# Patient Record
Sex: Female | Born: 1935 | Race: White | Hispanic: No | Smoking: Never smoker
Health system: Southern US, Community
[De-identification: ages and names within clinical notes are randomized; demographics above are authoritative.]

## PROBLEM LIST (undated history)

## (undated) DIAGNOSIS — G459 Transient cerebral ischemic attack, unspecified: Secondary | ICD-10-CM

## (undated) DIAGNOSIS — M1990 Unspecified osteoarthritis, unspecified site: Secondary | ICD-10-CM

## (undated) DIAGNOSIS — I6529 Occlusion and stenosis of unspecified carotid artery: Secondary | ICD-10-CM

## (undated) DIAGNOSIS — I10 Essential (primary) hypertension: Secondary | ICD-10-CM

## (undated) DIAGNOSIS — Z87440 Personal history of urinary (tract) infections: Secondary | ICD-10-CM

## (undated) DIAGNOSIS — F0390 Unspecified dementia without behavioral disturbance: Secondary | ICD-10-CM

## (undated) DIAGNOSIS — E039 Hypothyroidism, unspecified: Secondary | ICD-10-CM

## (undated) HISTORY — PX: TOTAL KNEE ARTHROPLASTY: SHX125

## (undated) HISTORY — PX: CHOLECYSTECTOMY: SHX55

## (undated) HISTORY — PX: APPENDECTOMY: SHX54

---

## 2007-08-07 ENCOUNTER — Emergency Department: Payer: Self-pay | Admitting: Emergency Medicine

## 2007-08-07 ENCOUNTER — Other Ambulatory Visit: Payer: Self-pay

## 2007-08-31 ENCOUNTER — Ambulatory Visit: Payer: Self-pay | Admitting: Gastroenterology

## 2008-04-15 ENCOUNTER — Emergency Department: Payer: Self-pay | Admitting: Emergency Medicine

## 2009-01-18 ENCOUNTER — Emergency Department: Payer: Self-pay | Admitting: Unknown Physician Specialty

## 2009-06-14 ENCOUNTER — Emergency Department: Payer: Self-pay | Admitting: Emergency Medicine

## 2009-07-03 ENCOUNTER — Emergency Department: Payer: Self-pay | Admitting: Internal Medicine

## 2009-09-07 ENCOUNTER — Ambulatory Visit: Payer: Self-pay | Admitting: Cardiovascular Disease

## 2009-09-07 ENCOUNTER — Inpatient Hospital Stay: Payer: Self-pay | Admitting: Internal Medicine

## 2009-09-07 ENCOUNTER — Emergency Department: Payer: Self-pay | Admitting: Unknown Physician Specialty

## 2009-09-15 ENCOUNTER — Encounter: Payer: Self-pay | Admitting: Internal Medicine

## 2010-02-14 ENCOUNTER — Ambulatory Visit: Payer: Self-pay | Admitting: Internal Medicine

## 2010-02-26 ENCOUNTER — Emergency Department: Payer: Self-pay | Admitting: Emergency Medicine

## 2010-03-08 ENCOUNTER — Inpatient Hospital Stay: Payer: Self-pay | Admitting: Internal Medicine

## 2010-03-11 ENCOUNTER — Encounter: Payer: Self-pay | Admitting: Internal Medicine

## 2010-03-17 ENCOUNTER — Encounter: Payer: Self-pay | Admitting: Internal Medicine

## 2010-03-17 ENCOUNTER — Ambulatory Visit: Payer: Self-pay | Admitting: Internal Medicine

## 2010-04-15 ENCOUNTER — Encounter: Payer: Self-pay | Admitting: Internal Medicine

## 2010-05-16 ENCOUNTER — Encounter: Payer: Self-pay | Admitting: Internal Medicine

## 2010-06-15 ENCOUNTER — Encounter: Payer: Self-pay | Admitting: Internal Medicine

## 2010-07-16 ENCOUNTER — Encounter: Payer: Self-pay | Admitting: Internal Medicine

## 2010-08-15 ENCOUNTER — Encounter: Payer: Self-pay | Admitting: Internal Medicine

## 2010-09-04 ENCOUNTER — Emergency Department: Payer: Self-pay | Admitting: Emergency Medicine

## 2010-09-15 ENCOUNTER — Encounter: Payer: Self-pay | Admitting: Internal Medicine

## 2010-10-16 ENCOUNTER — Encounter: Payer: Self-pay | Admitting: Internal Medicine

## 2010-11-15 ENCOUNTER — Encounter: Payer: Self-pay | Admitting: Internal Medicine

## 2010-12-16 ENCOUNTER — Encounter: Payer: Self-pay | Admitting: Internal Medicine

## 2011-01-15 ENCOUNTER — Encounter: Payer: Self-pay | Admitting: Internal Medicine

## 2011-02-15 ENCOUNTER — Encounter: Payer: Self-pay | Admitting: Internal Medicine

## 2011-03-16 LAB — URINALYSIS, COMPLETE
Bilirubin,UR: NEGATIVE
Blood: NEGATIVE
Ketone: NEGATIVE
Ph: 6 (ref 4.5–8.0)
Protein: NEGATIVE
RBC,UR: 1 /HPF (ref 0–5)
Specific Gravity: 1.008 (ref 1.003–1.030)
Squamous Epithelial: 1
WBC UR: 2 /HPF (ref 0–5)

## 2011-03-17 LAB — URINE CULTURE

## 2011-03-18 ENCOUNTER — Encounter: Payer: Self-pay | Admitting: Internal Medicine

## 2011-03-28 LAB — URINALYSIS, COMPLETE
Bacteria: NONE SEEN
Bilirubin,UR: NEGATIVE
Blood: NEGATIVE
Nitrite: NEGATIVE
Ph: 5 (ref 4.5–8.0)
Protein: NEGATIVE
RBC,UR: <1 /HPF (ref 0–5)
Specific Gravity: 1.008 (ref 1.003–1.030)
Squamous Epithelial: 1
WBC UR: 2 /HPF (ref 0–5)

## 2011-04-15 ENCOUNTER — Encounter: Payer: Self-pay | Admitting: Internal Medicine

## 2011-05-16 ENCOUNTER — Encounter: Payer: Self-pay | Admitting: Internal Medicine

## 2011-05-26 LAB — BASIC METABOLIC PANEL
Anion Gap: 10 (ref 7–16)
BUN: 29 mg/dL — ABNORMAL HIGH (ref 7–18)
Chloride: 105 mmol/L (ref 98–107)
Creatinine: 0.91 mg/dL (ref 0.60–1.30)
EGFR (African American): >60
EGFR (Non-African Amer.): >60
Glucose: 75 mg/dL (ref 65–99)
Osmolality: 282 (ref 275–301)
Potassium: 4.6 mmol/L (ref 3.5–5.1)
Sodium: 139 mmol/L (ref 136–145)

## 2011-06-15 ENCOUNTER — Encounter: Payer: Self-pay | Admitting: Internal Medicine

## 2011-07-16 ENCOUNTER — Encounter: Payer: Self-pay | Admitting: Internal Medicine

## 2011-07-26 ENCOUNTER — Emergency Department: Payer: Self-pay | Admitting: Emergency Medicine

## 2011-08-15 ENCOUNTER — Encounter: Payer: Self-pay | Admitting: Internal Medicine

## 2011-08-25 LAB — CBC WITH DIFFERENTIAL/PLATELET
Basophil %: 0.2 %
Eosinophil #: 0 10*3/uL (ref 0.0–0.7)
HCT: 31.9 % — ABNORMAL LOW (ref 35.0–47.0)
HGB: 10.2 g/dL — ABNORMAL LOW (ref 12.0–16.0)
Lymphocyte %: 5.3 %
MCHC: 31.9 g/dL — ABNORMAL LOW (ref 32.0–36.0)
MCV: 87 fL (ref 80–100)
Monocyte #: 0.3 x10 3/mm (ref 0.2–0.9)
Monocyte %: 2.7 %
Neutrophil %: 91.8 %
RBC: 3.68 10*6/uL — ABNORMAL LOW (ref 3.80–5.20)
RDW: 15.7 % — ABNORMAL HIGH (ref 11.5–14.5)
WBC: 11.9 10*3/uL — ABNORMAL HIGH (ref 3.6–11.0)

## 2011-08-25 LAB — COMPREHENSIVE METABOLIC PANEL
Albumin: 2.6 g/dL — ABNORMAL LOW (ref 3.4–5.0)
Alkaline Phosphatase: 103 U/L (ref 50–136)
Anion Gap: 11 (ref 7–16)
Calcium, Total: 8.7 mg/dL (ref 8.5–10.1)
Co2: 28 mmol/L (ref 21–32)
EGFR (Non-African Amer.): 21 — ABNORMAL LOW
Glucose: 210 mg/dL — ABNORMAL HIGH (ref 65–99)
Osmolality: 297 (ref 275–301)
Potassium: 2.9 mmol/L — ABNORMAL LOW (ref 3.5–5.1)
SGOT(AST): 23 U/L (ref 15–37)
SGPT (ALT): 14 U/L
Total Protein: 7.3 g/dL (ref 6.4–8.2)

## 2011-08-25 LAB — URINALYSIS, COMPLETE
Bilirubin,UR: NEGATIVE
Blood: NEGATIVE
Ketone: NEGATIVE
Ph: 5 (ref 4.5–8.0)
Specific Gravity: 1.019 (ref 1.003–1.030)
Squamous Epithelial: 1
WBC UR: 153 /HPF (ref 0–5)

## 2011-08-28 LAB — URINE CULTURE

## 2011-09-08 LAB — BASIC METABOLIC PANEL
BUN: 17 mg/dL (ref 7–18)
Co2: 29 mmol/L (ref 21–32)
Creatinine: 1.07 mg/dL (ref 0.60–1.30)
EGFR (African American): 55 — ABNORMAL LOW
Sodium: 141 mmol/L (ref 136–145)

## 2011-09-08 LAB — MAGNESIUM: Magnesium: 1.7 mg/dL — ABNORMAL LOW

## 2011-09-15 ENCOUNTER — Encounter: Payer: Self-pay | Admitting: Internal Medicine

## 2011-10-16 ENCOUNTER — Encounter: Payer: Self-pay | Admitting: Internal Medicine

## 2011-11-07 LAB — URINALYSIS, COMPLETE
Bilirubin,UR: NEGATIVE
Glucose,UR: NEGATIVE mg/dL (ref 0–75)
Nitrite: NEGATIVE
RBC,UR: 17 /HPF (ref 0–5)
Specific Gravity: 1.017 (ref 1.003–1.030)
Squamous Epithelial: NONE SEEN

## 2011-11-10 LAB — URINE CULTURE

## 2011-11-15 ENCOUNTER — Encounter: Payer: Self-pay | Admitting: Internal Medicine

## 2011-12-01 LAB — URINALYSIS, COMPLETE
Blood: NEGATIVE
Nitrite: NEGATIVE
Protein: 100
Specific Gravity: 1.018 (ref 1.003–1.030)
Squamous Epithelial: 17
WBC UR: 3527 /HPF (ref 0–5)

## 2011-12-08 LAB — LIPID PANEL
Cholesterol: 160 mg/dL (ref 0–200)
Ldl Cholesterol, Calc: 92 mg/dL (ref 0–100)
Triglycerides: 149 mg/dL (ref 0–200)
VLDL Cholesterol, Calc: 30 mg/dL (ref 5–40)

## 2011-12-16 ENCOUNTER — Encounter: Payer: Self-pay | Admitting: Internal Medicine

## 2012-01-15 ENCOUNTER — Encounter: Payer: Self-pay | Admitting: Internal Medicine

## 2012-02-15 ENCOUNTER — Encounter: Payer: Self-pay | Admitting: Internal Medicine

## 2012-03-17 ENCOUNTER — Encounter: Payer: Self-pay | Admitting: Internal Medicine

## 2012-03-20 LAB — URINALYSIS, COMPLETE
Bilirubin,UR: NEGATIVE
Glucose,UR: NEGATIVE mg/dL (ref 0–75)
Ketone: NEGATIVE
Nitrite: POSITIVE
Ph: 5 (ref 4.5–8.0)
RBC,UR: 4 /HPF (ref 0–5)
Specific Gravity: 1.018 (ref 1.003–1.030)
Squamous Epithelial: 2

## 2012-03-22 LAB — URINE CULTURE

## 2012-03-30 LAB — TSH: Thyroid Stimulating Horm: 1.19 u[IU]/mL

## 2012-04-14 ENCOUNTER — Encounter: Payer: Self-pay | Admitting: Internal Medicine

## 2012-05-15 ENCOUNTER — Encounter: Payer: Self-pay | Admitting: Internal Medicine

## 2012-05-24 LAB — LIPID PANEL
Cholesterol: 184 mg/dL (ref 0–200)
HDL Cholesterol: 38 mg/dL — ABNORMAL LOW (ref 40–60)
Triglycerides: 212 mg/dL — ABNORMAL HIGH (ref 0–200)
VLDL Cholesterol, Calc: 42 mg/dL — ABNORMAL HIGH (ref 5–40)

## 2012-05-24 LAB — MAGNESIUM: Magnesium: 1.9 mg/dL

## 2012-06-14 ENCOUNTER — Encounter: Payer: Self-pay | Admitting: Internal Medicine

## 2012-07-15 ENCOUNTER — Encounter: Payer: Self-pay | Admitting: Internal Medicine

## 2012-07-17 LAB — TSH: Thyroid Stimulating Horm: 13.5 u[IU]/mL — ABNORMAL HIGH

## 2012-08-14 ENCOUNTER — Encounter: Payer: Self-pay | Admitting: Internal Medicine

## 2012-09-14 ENCOUNTER — Encounter: Payer: Self-pay | Admitting: Internal Medicine

## 2012-10-15 ENCOUNTER — Encounter: Payer: Self-pay | Admitting: Internal Medicine

## 2012-11-14 ENCOUNTER — Encounter: Payer: Self-pay | Admitting: Internal Medicine

## 2012-12-15 ENCOUNTER — Encounter: Payer: Self-pay | Admitting: Internal Medicine

## 2013-01-14 ENCOUNTER — Encounter: Payer: Self-pay | Admitting: Internal Medicine

## 2013-02-14 ENCOUNTER — Encounter: Payer: Self-pay | Admitting: Internal Medicine

## 2013-03-07 LAB — BASIC METABOLIC PANEL
Anion Gap: 6 — ABNORMAL LOW (ref 7–16)
BUN: 17 mg/dL (ref 7–18)
CREATININE: 1.08 mg/dL (ref 0.60–1.30)
Calcium, Total: 8.7 mg/dL (ref 8.5–10.1)
Chloride: 106 mmol/L (ref 98–107)
Co2: 27 mmol/L (ref 21–32)
EGFR (African American): 53 — ABNORMAL LOW
EGFR (Non-African Amer.): 46 — ABNORMAL LOW
Glucose: 127 mg/dL — ABNORMAL HIGH (ref 65–99)
OSMOLALITY: 281 (ref 275–301)
Potassium: 3.9 mmol/L (ref 3.5–5.1)
Sodium: 139 mmol/L (ref 136–145)

## 2013-03-17 ENCOUNTER — Encounter: Payer: Self-pay | Admitting: Internal Medicine

## 2013-03-29 LAB — URINALYSIS, COMPLETE
Bacteria: NONE SEEN
Bilirubin,UR: NEGATIVE
Glucose,UR: NEGATIVE mg/dL (ref 0–75)
Ketone: NEGATIVE
NITRITE: NEGATIVE
PH: 6 (ref 4.5–8.0)
RBC,UR: 43 /HPF (ref 0–5)
Specific Gravity: 1.019 (ref 1.003–1.030)
Squamous Epithelial: 2
WBC UR: 771 /HPF (ref 0–5)

## 2013-04-01 LAB — URINE CULTURE

## 2013-04-14 ENCOUNTER — Encounter: Payer: Self-pay | Admitting: Internal Medicine

## 2013-05-15 ENCOUNTER — Encounter: Payer: Self-pay | Admitting: Internal Medicine

## 2013-06-13 LAB — MAGNESIUM: MAGNESIUM: 1.8 mg/dL

## 2013-06-13 LAB — TSH: THYROID STIMULATING HORM: 32.9 u[IU]/mL — AB

## 2013-06-26 ENCOUNTER — Inpatient Hospital Stay: Payer: Self-pay | Admitting: Internal Medicine

## 2013-06-26 LAB — COMPREHENSIVE METABOLIC PANEL
ALK PHOS: 97 U/L
ALT: 11 U/L — AB (ref 12–78)
ANION GAP: 12 (ref 7–16)
AST: 26 U/L (ref 15–37)
Albumin: 3.2 g/dL — ABNORMAL LOW (ref 3.4–5.0)
Albumin: 3.4 g/dL (ref 3.4–5.0)
Alkaline Phosphatase: 94 U/L
Anion Gap: 6 — ABNORMAL LOW (ref 7–16)
BUN: 15 mg/dL (ref 7–18)
BUN: 15 mg/dL (ref 7–18)
Bilirubin,Total: 0.4 mg/dL (ref 0.2–1.0)
Bilirubin,Total: 0.3 mg/dL (ref 0.2–1.0)
CHLORIDE: 104 mmol/L (ref 98–107)
CHLORIDE: 104 mmol/L (ref 98–107)
CO2: 28 mmol/L (ref 21–32)
CREATININE: 1.3 mg/dL (ref 0.60–1.30)
CREATININE: 1.34 mg/dL — AB (ref 0.60–1.30)
Calcium, Total: 9.2 mg/dL (ref 8.5–10.1)
Calcium, Total: 9.1 mg/dL (ref 8.5–10.1)
Co2: 21 mmol/L (ref 21–32)
EGFR (African American): 41 — ABNORMAL LOW
EGFR (Non-African Amer.): 37 — ABNORMAL LOW
EGFR (Non-African Amer.): 36 — ABNORMAL LOW
GFR CALC AF AMER: 43 — AB
Glucose: 140 mg/dL — ABNORMAL HIGH (ref 65–99)
Glucose: 107 mg/dL — ABNORMAL HIGH (ref 65–99)
OSMOLALITY: 277 (ref 275–301)
OSMOLALITY: 277 (ref 275–301)
Potassium: 4.1 mmol/L (ref 3.5–5.1)
Potassium: 3.7 mmol/L (ref 3.5–5.1)
SGOT(AST): 19 U/L (ref 15–37)
SGPT (ALT): 10 U/L — ABNORMAL LOW (ref 12–78)
SODIUM: 138 mmol/L (ref 136–145)
Sodium: 137 mmol/L (ref 136–145)
Total Protein: 8 g/dL (ref 6.4–8.2)
Total Protein: 8.1 g/dL (ref 6.4–8.2)

## 2013-06-26 LAB — CBC
HCT: 23.8 % — AB (ref 35.0–47.0)
HGB: 6.7 g/dL — ABNORMAL LOW (ref 12.0–16.0)
MCH: 17.1 pg — ABNORMAL LOW (ref 26.0–34.0)
MCHC: 28 g/dL — AB (ref 32.0–36.0)
MCV: 61 fL — AB (ref 80–100)
PLATELETS: 316 10*3/uL (ref 150–440)
RBC: 3.89 10*6/uL (ref 3.80–5.20)
RDW: 22.5 % — ABNORMAL HIGH (ref 11.5–14.5)
WBC: 10.1 10*3/uL (ref 3.6–11.0)

## 2013-06-26 LAB — IRON AND TIBC
Iron Bind.Cap.(Total): 515 ug/dL — ABNORMAL HIGH (ref 250–450)
Iron Saturation: 4 %
Iron: 21 ug/dL — ABNORMAL LOW (ref 50–170)
UNBOUND IRON-BIND. CAP.: 494 ug/dL

## 2013-06-26 LAB — CBC WITH DIFFERENTIAL/PLATELET
BASOS PCT: 3.4 %
Basophil #: 0.4 10*3/uL — ABNORMAL HIGH (ref 0.0–0.1)
EOS ABS: 0.2 10*3/uL (ref 0.0–0.7)
EOS PCT: 2.3 %
HCT: 24.7 % — ABNORMAL LOW (ref 35.0–47.0)
HGB: 6.7 g/dL — AB (ref 12.0–16.0)
LYMPHS ABS: 1.8 10*3/uL (ref 1.0–3.6)
LYMPHS PCT: 17.9 %
MCH: 16.7 pg — ABNORMAL LOW (ref 26.0–34.0)
MCHC: 27.1 g/dL — ABNORMAL LOW (ref 32.0–36.0)
MCV: 62 fL — AB (ref 80–100)
Monocyte #: 0.6 x10 3/mm (ref 0.2–0.9)
Monocyte %: 5.5 %
NEUTROS ABS: 7.3 10*3/uL — AB (ref 1.4–6.5)
NEUTROS PCT: 70.9 %
PLATELETS: 298 10*3/uL (ref 150–440)
RBC: 4.01 10*6/uL (ref 3.80–5.20)
RDW: 22.1 % — ABNORMAL HIGH (ref 11.5–14.5)
WBC: 10.2 10*3/uL (ref 3.6–11.0)

## 2013-06-26 LAB — URINALYSIS, COMPLETE
Bilirubin,UR: NEGATIVE
Glucose,UR: NEGATIVE mg/dL (ref 0–75)
Ketone: NEGATIVE
Nitrite: NEGATIVE
Ph: 5 (ref 4.5–8.0)
Protein: 100
RBC,UR: 13 /HPF (ref 0–5)
SPECIFIC GRAVITY: 1.018 (ref 1.003–1.030)
Squamous Epithelial: 8

## 2013-06-27 LAB — BASIC METABOLIC PANEL
Anion Gap: 6 — ABNORMAL LOW (ref 7–16)
BUN: 16 mg/dL (ref 7–18)
CHLORIDE: 106 mmol/L (ref 98–107)
CO2: 27 mmol/L (ref 21–32)
Calcium, Total: 8.9 mg/dL (ref 8.5–10.1)
Creatinine: 1.23 mg/dL (ref 0.60–1.30)
EGFR (African American): 46 — ABNORMAL LOW
GFR CALC NON AF AMER: 39 — AB
GLUCOSE: 105 mg/dL — AB (ref 65–99)
OSMOLALITY: 279 (ref 275–301)
Potassium: 4.3 mmol/L (ref 3.5–5.1)
Sodium: 139 mmol/L (ref 136–145)

## 2013-06-27 LAB — CBC WITH DIFFERENTIAL/PLATELET
BASOS ABS: 0.1 10*3/uL (ref 0.0–0.1)
Basophil %: 1.1 %
EOS PCT: 1.9 %
Eosinophil #: 0.2 10*3/uL (ref 0.0–0.7)
HCT: 26.3 % — ABNORMAL LOW (ref 35.0–47.0)
HGB: 7.9 g/dL — AB (ref 12.0–16.0)
Lymphocyte #: 1.9 10*3/uL (ref 1.0–3.6)
Lymphocyte %: 19.8 %
MCH: 19.7 pg — ABNORMAL LOW (ref 26.0–34.0)
MCHC: 30 g/dL — ABNORMAL LOW (ref 32.0–36.0)
MCV: 66 fL — AB (ref 80–100)
MONO ABS: 0.8 x10 3/mm (ref 0.2–0.9)
MONOS PCT: 8.1 %
NEUTROS ABS: 6.6 10*3/uL — AB (ref 1.4–6.5)
NEUTROS PCT: 69.1 %
Platelet: 235 10*3/uL (ref 150–440)
RBC: 4.01 10*6/uL (ref 3.80–5.20)
RDW: 28.6 % — ABNORMAL HIGH (ref 11.5–14.5)
WBC: 9.5 10*3/uL (ref 3.6–11.0)

## 2013-06-28 LAB — URINE CULTURE

## 2013-07-02 ENCOUNTER — Encounter: Payer: Self-pay | Admitting: Internal Medicine

## 2013-07-02 LAB — CBC WITH DIFFERENTIAL/PLATELET
BASOS ABS: 0.1 10*3/uL (ref 0.0–0.1)
BASOS PCT: 1.4 %
EOS ABS: 0.4 10*3/uL (ref 0.0–0.7)
Eosinophil %: 4.8 %
HCT: 27.1 % — ABNORMAL LOW (ref 35.0–47.0)
HGB: 8 g/dL — ABNORMAL LOW (ref 12.0–16.0)
Lymphocyte #: 1.2 10*3/uL (ref 1.0–3.6)
Lymphocyte %: 14.9 %
MCH: 19.9 pg — AB (ref 26.0–34.0)
MCHC: 29.5 g/dL — AB (ref 32.0–36.0)
MCV: 67 fL — AB (ref 80–100)
Monocyte #: 0.6 x10 3/mm (ref 0.2–0.9)
Monocyte %: 7.3 %
Neutrophil #: 5.7 10*3/uL (ref 1.4–6.5)
Neutrophil %: 71.6 %
Platelet: 193 10*3/uL (ref 150–440)
RBC: 4.02 10*6/uL (ref 3.80–5.20)
RDW: 30.2 % — ABNORMAL HIGH (ref 11.5–14.5)
WBC: 8 10*3/uL (ref 3.6–11.0)

## 2013-07-15 ENCOUNTER — Encounter: Payer: Self-pay | Admitting: Internal Medicine

## 2013-08-01 LAB — CBC WITH DIFFERENTIAL/PLATELET
Basophil #: 0.1 10*3/uL (ref 0.0–0.1)
Basophil %: 2.3 %
EOS ABS: 0.3 10*3/uL (ref 0.0–0.7)
Eosinophil %: 5.3 %
HCT: 36.2 % (ref 35.0–47.0)
HGB: 11.1 g/dL — ABNORMAL LOW (ref 12.0–16.0)
LYMPHS PCT: 17.7 %
Lymphocyte #: 1.1 10*3/uL (ref 1.0–3.6)
MCH: 23.9 pg — ABNORMAL LOW (ref 26.0–34.0)
MCHC: 30.7 g/dL — ABNORMAL LOW (ref 32.0–36.0)
MCV: 78 fL — ABNORMAL LOW (ref 80–100)
Monocyte #: 0.5 x10 3/mm (ref 0.2–0.9)
Monocyte %: 7.7 %
Neutrophil #: 4.2 10*3/uL (ref 1.4–6.5)
Neutrophil %: 67 %
Platelet: 280 10*3/uL (ref 150–440)
RBC: 4.64 10*6/uL (ref 3.80–5.20)
RDW: 34 % — ABNORMAL HIGH (ref 11.5–14.5)
WBC: 6.3 10*3/uL (ref 3.6–11.0)

## 2013-08-01 LAB — TSH: Thyroid Stimulating Horm: 0.322 u[IU]/mL — ABNORMAL LOW

## 2013-08-14 ENCOUNTER — Encounter: Payer: Self-pay | Admitting: Internal Medicine

## 2013-09-12 LAB — TSH: THYROID STIMULATING HORM: 0.169 u[IU]/mL — AB

## 2013-09-12 LAB — CBC WITH DIFFERENTIAL/PLATELET
Basophil #: 0.1 10*3/uL (ref 0.0–0.1)
Basophil %: 1 %
EOS PCT: 4.8 %
Eosinophil #: 0.3 10*3/uL (ref 0.0–0.7)
HCT: 39.5 % (ref 35.0–47.0)
HGB: 12.4 g/dL (ref 12.0–16.0)
LYMPHS ABS: 1.1 10*3/uL (ref 1.0–3.6)
Lymphocyte %: 16 %
MCH: 27.3 pg (ref 26.0–34.0)
MCHC: 31.4 g/dL — ABNORMAL LOW (ref 32.0–36.0)
MCV: 87 fL (ref 80–100)
MONO ABS: 0.5 x10 3/mm (ref 0.2–0.9)
Monocyte %: 7.1 %
Neutrophil #: 5 10*3/uL (ref 1.4–6.5)
Neutrophil %: 71.1 %
Platelet: 251 10*3/uL (ref 150–440)
RBC: 4.56 10*6/uL (ref 3.80–5.20)
RDW: 23.9 % — ABNORMAL HIGH (ref 11.5–14.5)
WBC: 7.1 10*3/uL (ref 3.6–11.0)

## 2013-09-12 LAB — LIPID PANEL
Cholesterol: 179 mg/dL (ref 0–200)
HDL: 43 mg/dL (ref 40–60)
Ldl Cholesterol, Calc: 110 mg/dL — ABNORMAL HIGH (ref 0–100)
Triglycerides: 128 mg/dL (ref 0–200)
VLDL Cholesterol, Calc: 26 mg/dL (ref 5–40)

## 2013-09-14 ENCOUNTER — Encounter: Payer: Self-pay | Admitting: Internal Medicine

## 2013-10-15 ENCOUNTER — Encounter: Payer: Self-pay | Admitting: Internal Medicine

## 2013-10-24 LAB — CBC WITH DIFFERENTIAL/PLATELET
BASOS ABS: 0.1 10*3/uL (ref 0.0–0.1)
Basophil %: 1.2 %
Eosinophil #: 0.6 10*3/uL (ref 0.0–0.7)
Eosinophil %: 9.2 %
HCT: 40.3 % (ref 35.0–47.0)
HGB: 12.7 g/dL (ref 12.0–16.0)
Lymphocyte #: 1.4 10*3/uL (ref 1.0–3.6)
Lymphocyte %: 20 %
MCH: 28.2 pg (ref 26.0–34.0)
MCHC: 31.5 g/dL — ABNORMAL LOW (ref 32.0–36.0)
MCV: 90 fL (ref 80–100)
Monocyte #: 0.5 x10 3/mm (ref 0.2–0.9)
Monocyte %: 8.1 %
NEUTROS ABS: 4.2 10*3/uL (ref 1.4–6.5)
NEUTROS PCT: 61.5 %
Platelet: 274 10*3/uL (ref 150–440)
RBC: 4.51 10*6/uL (ref 3.80–5.20)
RDW: 14.6 % — ABNORMAL HIGH (ref 11.5–14.5)
WBC: 6.8 10*3/uL (ref 3.6–11.0)

## 2013-10-24 LAB — TSH: Thyroid Stimulating Horm: 1.81 u[IU]/mL

## 2013-11-14 ENCOUNTER — Encounter: Payer: Self-pay | Admitting: Internal Medicine

## 2013-11-18 ENCOUNTER — Emergency Department: Payer: Self-pay | Admitting: Emergency Medicine

## 2013-11-18 ENCOUNTER — Observation Stay (HOSPITAL_COMMUNITY)
Admission: AD | Admit: 2013-11-18 | Discharge: 2013-11-19 | Disposition: A | Discharge status: Skilled Nursing Facility | Payer: Medicare Other | Source: Other Acute Inpatient Hospital | Attending: Internal Medicine | Admitting: Internal Medicine

## 2013-11-18 ENCOUNTER — Encounter (HOSPITAL_COMMUNITY): Payer: Self-pay | Admitting: General Practice

## 2013-11-18 ENCOUNTER — Observation Stay (HOSPITAL_COMMUNITY): Payer: Medicare Other

## 2013-11-18 DIAGNOSIS — E039 Hypothyroidism, unspecified: Secondary | ICD-10-CM | POA: Diagnosis not present

## 2013-11-18 DIAGNOSIS — Z66 Do not resuscitate: Secondary | ICD-10-CM | POA: Diagnosis not present

## 2013-11-18 DIAGNOSIS — I6200 Nontraumatic subdural hemorrhage, unspecified: Secondary | ICD-10-CM

## 2013-11-18 DIAGNOSIS — Z23 Encounter for immunization: Secondary | ICD-10-CM | POA: Insufficient documentation

## 2013-11-18 DIAGNOSIS — W19XXXA Unspecified fall, initial encounter: Secondary | ICD-10-CM | POA: Insufficient documentation

## 2013-11-18 DIAGNOSIS — I10 Essential (primary) hypertension: Secondary | ICD-10-CM

## 2013-11-18 DIAGNOSIS — F0390 Unspecified dementia without behavioral disturbance: Secondary | ICD-10-CM | POA: Insufficient documentation

## 2013-11-18 DIAGNOSIS — Z79899 Other long term (current) drug therapy: Secondary | ICD-10-CM | POA: Insufficient documentation

## 2013-11-18 DIAGNOSIS — F0280 Dementia in other diseases classified elsewhere without behavioral disturbance: Secondary | ICD-10-CM | POA: Diagnosis not present

## 2013-11-18 DIAGNOSIS — S065XAA Traumatic subdural hemorrhage with loss of consciousness status unknown, initial encounter: Secondary | ICD-10-CM | POA: Diagnosis present

## 2013-11-18 DIAGNOSIS — Y92129 Unspecified place in nursing home as the place of occurrence of the external cause: Secondary | ICD-10-CM | POA: Diagnosis not present

## 2013-11-18 DIAGNOSIS — E038 Other specified hypothyroidism: Secondary | ICD-10-CM

## 2013-11-18 DIAGNOSIS — S065X0A Traumatic subdural hemorrhage without loss of consciousness, initial encounter: Principal | ICD-10-CM | POA: Insufficient documentation

## 2013-11-18 DIAGNOSIS — S065X9A Traumatic subdural hemorrhage with loss of consciousness of unspecified duration, initial encounter: Secondary | ICD-10-CM

## 2013-11-18 HISTORY — DX: Essential (primary) hypertension: I10

## 2013-11-18 HISTORY — DX: Personal history of urinary (tract) infections: Z87.440

## 2013-11-18 HISTORY — DX: Occlusion and stenosis of unspecified carotid artery: I65.29

## 2013-11-18 HISTORY — DX: Unspecified osteoarthritis, unspecified site: M19.90

## 2013-11-18 HISTORY — DX: Unspecified dementia, unspecified severity, without behavioral disturbance, psychotic disturbance, mood disturbance, and anxiety: F03.90

## 2013-11-18 HISTORY — DX: Transient cerebral ischemic attack, unspecified: G45.9

## 2013-11-18 HISTORY — DX: Hypothyroidism, unspecified: E03.9

## 2013-11-18 LAB — CBC
HEMATOCRIT: 38.7 % (ref 36.0–46.0)
Hemoglobin: 12.9 g/dL (ref 12.0–15.0)
MCH: 29.1 pg (ref 26.0–34.0)
MCHC: 33.3 g/dL (ref 30.0–36.0)
MCV: 87.4 fL (ref 78.0–100.0)
PLATELETS: 302 10*3/uL (ref 150–400)
RBC: 4.43 MIL/uL (ref 3.87–5.11)
RDW: 13.8 % (ref 11.5–15.5)
WBC: 9.7 10*3/uL (ref 4.0–10.5)

## 2013-11-18 LAB — CBC WITH DIFFERENTIAL/PLATELET
Basophil #: 0.1 10*3/uL (ref 0.0–0.1)
Basophil %: 0.5 %
Eosinophil #: 0.1 10*3/uL (ref 0.0–0.7)
Eosinophil %: 1 %
HCT: 44.4 % (ref 35.0–47.0)
HGB: 14 g/dL (ref 12.0–16.0)
Lymphocyte #: 0.8 10*3/uL — ABNORMAL LOW (ref 1.0–3.6)
Lymphocyte %: 6.4 %
MCH: 28.2 pg (ref 26.0–34.0)
MCHC: 31.4 g/dL — ABNORMAL LOW (ref 32.0–36.0)
MCV: 90 fL (ref 80–100)
MONO ABS: 0.6 x10 3/mm (ref 0.2–0.9)
Monocyte %: 4.6 %
Neutrophil #: 10.8 10*3/uL — ABNORMAL HIGH (ref 1.4–6.5)
Neutrophil %: 87.5 %
Platelet: 287 10*3/uL (ref 150–440)
RBC: 4.96 10*6/uL (ref 3.80–5.20)
RDW: 14.1 % (ref 11.5–14.5)
WBC: 12.3 10*3/uL — AB (ref 3.6–11.0)

## 2013-11-18 LAB — BASIC METABOLIC PANEL
ANION GAP: 12 (ref 5–15)
Anion Gap: 6 — ABNORMAL LOW (ref 7–16)
BUN: 18 mg/dL (ref 7–18)
BUN: 16 mg/dL (ref 6–23)
CALCIUM: 9.5 mg/dL (ref 8.4–10.5)
CHLORIDE: 103 mmol/L (ref 98–107)
CHLORIDE: 100 meq/L (ref 96–112)
CO2: 25 mEq/L (ref 19–32)
CREATININE: 1.09 mg/dL (ref 0.60–1.30)
Calcium, Total: 9.5 mg/dL (ref 8.5–10.1)
Co2: 30 mmol/L (ref 21–32)
Creatinine, Ser: 0.96 mg/dL (ref 0.50–1.10)
EGFR (Non-African Amer.): 50 — ABNORMAL LOW
GFR calc non Af Amer: 51 mL/min — ABNORMAL LOW (ref >90)
GFR, EST AFRICAN AMERICAN: 59 mL/min — AB (ref >90)
Glucose, Bld: 118 mg/dL — ABNORMAL HIGH (ref 70–99)
Glucose: 108 mg/dL — ABNORMAL HIGH (ref 65–99)
Osmolality: 280 (ref 275–301)
Potassium: 4.2 mmol/L (ref 3.5–5.1)
Potassium: 4.7 mEq/L (ref 3.7–5.3)
Sodium: 139 mmol/L (ref 136–145)
Sodium: 137 mEq/L (ref 137–147)

## 2013-11-18 LAB — PROTIME-INR
INR: 1
INR: 1.08 (ref 0.00–1.49)
Prothrombin Time: 12.8 secs (ref 11.5–14.7)
Prothrombin Time: 14 seconds (ref 11.6–15.2)

## 2013-11-18 LAB — APTT: Activated PTT: 27.7 secs (ref 23.6–35.9)

## 2013-11-18 MED ORDER — INFLUENZA VAC SPLIT QUAD 0.5 ML IM SUSY
0.5000 mL | PREFILLED_SYRINGE | INTRAMUSCULAR | Status: AC
  Administered 2013-11-19: 0.5 mL via INTRAMUSCULAR
  Filled 2013-11-18: qty 0.5

## 2013-11-18 MED ORDER — ONDANSETRON HCL 4 MG PO TABS: 4.0000 mg | ORAL_TABLET | Freq: Four times a day (QID) | ORAL | Status: DC | PRN

## 2013-11-18 MED ORDER — SODIUM CHLORIDE 0.9 % IV SOLN: 250.0000 mL | INTRAVENOUS | Status: DC | PRN

## 2013-11-18 MED ORDER — ACETAMINOPHEN 325 MG PO TABS: 650.0000 mg | ORAL_TABLET | Freq: Four times a day (QID) | ORAL | Status: DC | PRN

## 2013-11-18 MED ORDER — ACETAMINOPHEN 650 MG RE SUPP
650.0000 mg | Freq: Four times a day (QID) | RECTAL | Status: DC | PRN
  Administered 2013-11-18: 650 mg via RECTAL
  Filled 2013-11-18: qty 1

## 2013-11-18 MED ORDER — SODIUM CHLORIDE 0.9 % IJ SOLN
3.0000 mL | Freq: Two times a day (BID) | INTRAMUSCULAR | Status: DC
  Administered 2013-11-18 – 2013-11-19 (×3): 3 mL via INTRAVENOUS

## 2013-11-18 MED ORDER — SODIUM CHLORIDE 0.9 % IJ SOLN: 3.0000 mL | INTRAMUSCULAR | Status: DC | PRN

## 2013-11-18 MED ORDER — ONDANSETRON HCL 4 MG/2ML IJ SOLN: 4.0000 mg | Freq: Four times a day (QID) | INTRAMUSCULAR | Status: DC | PRN

## 2013-11-18 MED ORDER — HYDRALAZINE HCL 20 MG/ML IJ SOLN: 10.0000 mg | Freq: Three times a day (TID) | INTRAMUSCULAR | Status: DC | PRN

## 2013-11-18 NOTE — H&P (Signed)
[facility] History and Physical  Jeaneane Adamec WUJ:[id number] DOB: 1923-10-27 DOA: 11/18/2013  Referring physician: Transfer from [place].  PCP: Lauro Regulus., MD   Chief Complaint: Fall.   HPI: Elika Godar is a 80 y.o. female with PMH significant for Severe dementia, HTN, Hypothyroidism who was transfer from [place] for observation. Patient fell at [facility]. She was found to have SDH. Per son patient at baseline unable to carrie a conversation, does not follow command, confuse. Patient seen and examined. She is not able to provide history due to dementia. She denies headaches or any pain.  A CT head done at [facility] showed:  anterior falx measuring 9 mm and 4.6 cm in length from anterior to posterior.   Review of Systems:  Unable to obtain from patient.   No past surgical history on file. Social History:  has no tobacco, alcohol, and drug history on file.  Allergies not on file PMH;  Severe dementia. Hypothyroidism HTN.  Family history; unable to obtain from patient.   Prior to Admission medications   Not on File   Physical Exam: There were no vitals filed for this visit.  Wt Readings from Last 3 Encounters:  No data found for Wt    General:  Appears calm and comfortable Eyes: PERRL, normal lids, irises & conjunctiva ENT: grossly normal hearing, lips & tongue Neck: no LAD, masses or thyromegaly Cardiovascular: RRR, positive m/no r/g. No LE edema. Telemetry: SR, no arrhythmias  Respiratory: CTA bilaterally, no w/r/r. Normal respiratory effort. Abdomen: soft, ntnd, ventral hernia, reducible.  Skin: no rash or induration seen on limited exam Musculoskeletal: grossly normal tone BUE/BLE Neurologic: does not follow command, unable to carrie a conversation. Moves hands passively, moves LE passively. Eyes open looking to left side most of the time. Say few words.           Labs on Admission:  Basic Metabolic Panel: No results found for this basename: NA, K,  CL, CO2, GLUCOSE, BUN, CREATININE, CALCIUM, MG, PHOS,  in the last 168 hours Liver Function Tests: No results found for this basename: AST, ALT, ALKPHOS, BILITOT, PROT, ALBUMIN,  in the last 168 hours No results found for this basename: LIPASE, AMYLASE,  in the last 168 hours No results found for this basename: AMMONIA,  in the last 168 hours CBC: No results found for this basename: WBC, NEUTROABS, HGB, HCT, MCV, PLT,  in the last 168 hours Cardiac Enzymes: No results found for this basename: CKTOTAL, CKMB, CKMBINDEX, TROPONINI,  in the last 168 hours  BNP (last 3 results) No results found for this basename: PROBNP,  in the last 8760 hours CBG: No results found for this basename: GLUCAP,  in the last 168 hours  Radiological Exams on Admission: No results found.  EKG: Will order.   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 CT head from [place] showed anterior falx measuring 9 mm and 4.6 cm in length from anterior to posterior. Discussed case with Dr Wynetta Emery. He reviewed CT head from [place]. Dr Wynetta Emery recommend repeat CT head tonight if SDH stable patient can return to SNF tomorrow. He relates that SDH is small, no surgical indication at this time. Will need to inform Dr Wynetta Emery if patient condition change.  -Avoid anticoagulation.  -Check INR, CBC, Bmet.  -Patient was not  on anticoagulation prior to admission.   2-HTN; pending home medication list to be review by pharmacy.   3-Hypothroydism: resume synthroid.   4-Dementia, severe: discussed with son, patient is not able to carrie a conversation or follow command at baseline.   Code Status: DNR.  DVT Prophylaxis: SCD no blood thinner in setting SDH.  Family Communication: Care discussed with son, Audree BaneBilly POA.  Disposition Plan: admit under  observation.   Time spent: 75 minutes.   Hartley Barefootegalado, Sarenity Ramaker A [facility] Pager [phone]

## 2013-11-19 DIAGNOSIS — F0280 Dementia in other diseases classified elsewhere without behavioral disturbance: Secondary | ICD-10-CM | POA: Diagnosis not present

## 2013-11-19 DIAGNOSIS — E039 Hypothyroidism, unspecified: Secondary | ICD-10-CM | POA: Diagnosis not present

## 2013-11-19 DIAGNOSIS — I6200 Nontraumatic subdural hemorrhage, unspecified: Secondary | ICD-10-CM | POA: Diagnosis not present

## 2013-11-19 DIAGNOSIS — S065X0A Traumatic subdural hemorrhage without loss of consciousness, initial encounter: Secondary | ICD-10-CM | POA: Diagnosis not present

## 2013-11-19 LAB — CBC
HCT: 38.1 % (ref 36.0–46.0)
Hemoglobin: 12.4 g/dL (ref 12.0–15.0)
MCH: 28.6 pg (ref 26.0–34.0)
MCHC: 32.5 g/dL (ref 30.0–36.0)
MCV: 87.8 fL (ref 78.0–100.0)
Platelets: 301 10*3/uL (ref 150–400)
RBC: 4.34 MIL/uL (ref 3.87–5.11)
RDW: 14.2 % (ref 11.5–15.5)
WBC: 6.8 10*3/uL (ref 4.0–10.5)

## 2013-11-19 LAB — BASIC METABOLIC PANEL
Anion gap: 15 (ref 5–15)
BUN: 18 mg/dL (ref 6–23)
CALCIUM: 9.3 mg/dL (ref 8.4–10.5)
CO2: 23 meq/L (ref 19–32)
Chloride: 103 mEq/L (ref 96–112)
Creatinine, Ser: 1.04 mg/dL (ref 0.50–1.10)
GFR calc Af Amer: 54 mL/min — ABNORMAL LOW (ref >90)
GFR, EST NON AFRICAN AMERICAN: 47 mL/min — AB (ref >90)
GLUCOSE: 117 mg/dL — AB (ref 70–99)
Potassium: 4.5 mEq/L (ref 3.7–5.3)
Sodium: 141 mEq/L (ref 137–147)

## 2013-11-19 MED ORDER — CETYLPYRIDINIUM CHLORIDE 0.05 % MT LIQD
7.0000 mL | Freq: Two times a day (BID) | OROMUCOSAL | Status: DC
  Administered 2013-11-19: 7 mL via OROMUCOSAL

## 2013-11-19 NOTE — Clinical Social Work Note (Signed)
[facility] Report #:[id number] Anticipated discharge date: 11/19/13 Family notified:[employer]illy [employer] by [employer]  CSW signing off.  Merlyn LotJenna Holoman, LCSWA Clinical Social Worker [phone]

## 2013-11-19 NOTE — Discharge Summary (Signed)
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 female with PMH significant for Severe dementia, HTN, Hypothyroidism who was transfer from [place] for observation. Patient fell at [facility]. She was found to have SDH. Per son patient at baseline unable to carrie a conversation, does not follow command, confuse. Patient seen and examined. She is not able to provide history due to dementia. She denies headaches or any pain.  A [facility] head [facility] at [facility] showed: anterior falx measuring 9 mm and 4.6 cm in length from anterior to posterior.   Hospital Course:  1-SDH: Patient presents after a fall, found to have SHD. CT head from [place] showed anterior falx measuring 9 mm and 4.6 cm in length from anterior to posterior. Discussed case with Dr Wynetta Emery. He reviewed CT head from [place]. Dr Wynetta Emery recommend repeat CT head tonight if SDH stable patient can return to SNF  -10/5: repeat CT: no change, stable SDH; Pt is being discharged to SNF with outpatient repeat head CT in 10 days  2-HTN; BP is stable off meds; cont outpatient follow op    3-Hypothroydism: resume synthroid.  4-Dementia, severe: discussed with son, patient is not able to carrie a conversation or follow command at baseline.    Procedures:  none (i.e. Studies not automatically included, echos, thoracentesis, etc;  not x-rays)  Consultations:  none  Discharge Exam: Filed Vitals:   11/19/13 0654  BP: 139/57  Pulse: 74  Temp: 98.2 F (36.8 C)  Resp: 18    General: alert, confused at baseline  Cardiovascular: s1,s2 rrr Respiratory: CTA BL  Discharge Instructions  Discharge Instructions   Diet - low sodium heart healthy    Complete by:  As directed      Discharge instructions    Complete by:  As directed   Please follow up with primary care doctor in 1-2 weeks     Increase activity slowly    Complete by:  As directed             Medication List         acetaminophen 325 MG tablet  Commonly known as:  TYLENOL  Take 650 mg by mouth every 6 (six) hours as needed for moderate pain.     albuterol 108 (90 BASE) MCG/ACT inhaler  Commonly known as:  PROVENTIL HFA;VENTOLIN HFA  Inhale 2 puffs into the lungs every 4 (four) hours as needed for wheezing or shortness of breath.     calcium carbonate 1250 MG capsule  Take 2,500 mg by mouth 3 (three) times daily with meals.     citalopram 10 MG tablet  Commonly known as:  CELEXA  Take 5 mg by mouth daily.     guaifenesin 100 MG/5ML syrup  Commonly known as:  ROBITUSSIN  Take 200 mg by mouth every 4 (four) hours as needed for cough.  hyoscyamine 0.375 MG 12 hr tablet  Commonly known as:  LEVBID  Take 0.375 mg by mouth daily.     levothyroxine 112 MCG tablet  Commonly known as:  SYNTHROID, LEVOTHROID  Take 112 mcg by mouth daily before breakfast.     mirtazapine 7.5 MG tablet  Commonly known as:  REMERON  Take 7.5 mg by mouth at bedtime.     omeprazole 20 MG capsule  Commonly known as:  PRILOSEC  Take 20 mg by mouth daily.     pravastatin 80 MG tablet  Commonly known as:  PRAVACHOL  Take 80 mg by mouth daily.     vitamin B-12 1000 MCG tablet  Commonly known as:  CYANOCOBALAMIN  Take 2,000 mcg by mouth daily.       Allergies  Allergen Reactions  . Demerol [Meperidine] Other (See Comments)    unknown  .  Epinephrine Other (See Comments)    unknown       Follow-up Information   Follow up with Lauro Regulus., MD In 1 week.   Specialty:  Internal Medicine   Contact information:   [address] [place] [address] [phone]        The results of significant diagnostics from this hospitalization (including imaging, microbiology, ancillary and laboratory) are listed below for reference.    Significant Diagnostic Studies: Ct Head Wo Contrast  11/18/2013   CLINICAL DATA:  Recent fall with resulting subdural hematoma. History of dementia and TIA. Initial encounter.  EXAM: CT HEAD WITHOUT CONTRAST  TECHNIQUE: Contiguous axial images were obtained from the base of the skull through the vertex without intravenous contrast.  COMPARISON:  Head CT same date from [facility].  FINDINGS: The interhemispheric subdural hematoma along the anterior aspect of the falx has not significantly changed in size. There is no other evidence of acute intracranial hemorrhage, mass lesion, brain edema or extra-axial fluid collection. Atrophy and chronic small vessel ischemic changes in the periventricular white matter are stable.  There is paranasal mucosal thickening with air-fluid levels in the right frontal sinus. No displaced facial fracture identified. There is soft tissue swelling in the right frontal scalp. The calvarium is intact. The mastoid air cells are clear.  IMPRESSION: 1. No significant change in interhemispheric subdural hematoma compared with the examination earlier the same date. 2. Moderate atrophy and chronic periventricular white matter disease. 3. Right frontal scalp soft tissue swelling with air-fluid levels in the paranasal sinuses. No calvarial or facial fracture identified.   Electronically Signed   By: Roxy Horseman M.D.   On: 11/18/2013 15:49    Microbiology: No results found for this or any previous visit (from the past 240 hour(s)).   Labs: Basic Metabolic  Panel:  Recent Labs Lab 11/18/13 1330 11/19/13 0411  NA 137 141  K 4.7 4.5  CL 100 103  CO2 25 23  GLUCOSE 118* 117*  BUN 16 18  CREATININE 0.96 1.04  CALCIUM 9.5 9.3   Liver Function Tests: No results found for this basename: AST, ALT, ALKPHOS, BILITOT, PROT, ALBUMIN,  in the last 168 hours No results found for this basename: LIPASE, AMYLASE,  in the last 168 hours No results found for this basename: AMMONIA,  in the last 168 hours CBC:  Recent Labs Lab 11/18/13 1330 11/19/13 0411  WBC 9.7 6.8  HGB 12.9 12.4  HCT 38.7 38.1  MCV 87.4 87.8  PLT 302 301   Cardiac Enzymes: No results found for this basename: CKTOTAL, CKMB,  CKMBINDEX, TROPONINI,  in the last 168 hours BNP: BNP (last 3 results) No results found for this basename: PROBNP,  in the last 8760 hours CBG: No results found for this basename: GLUCAP,  in the last 168 hours     Signed:  Esperanza SheetsBURIEV, Amjad Fikes N  [facility] 11/19/2013, 9:59 AM

## 2013-11-19 NOTE — Clinical Social Work Psychosocial (Signed)
Clinical Social Work Department BRIEF PSYCHOSOCIAL ASSESSMENT 11/19/2013  Patient:  Cathy Edwards,Cathy Edwards     Account Number:  [id number]     Admit date:  11/18/2013  Clinical Social Worker:  Merlyn LotHOLOMAN,Nya Monds, CLINICAL SOCIAL WORKER  Date/Time:  11/19/2013 10:41 AM  Referred by:  RN  Date Referred:  11/19/2013 Referred for  ALF Placement   Other Referral:   Interview type:  Family Other interview type:    PSYCHOSOCIAL DATA Living Status:  FACILITY Admitted from facility:  [facility] Level of care:  Assisted Living Primary support name:  Cathy Edwards Primary support relationship to patient:  CHILD, ADULT Degree of support available:   High level of support    CURRENT CONCERNS Current Concerns  Post-Acute Placement   Other Concerns:    SOCIAL WORK ASSESSMENT / PLAN CSW spoke with patients son.  Son reported patient has been staying [facility] and would return there after discharge.  CSW confirmed with facility that patient was able to return.  Patients son is agreeable to plan and is appreciative of hospital's level of care.  CSW will continue to follow   Assessment/plan status:  Psychosocial Support/Ongoing Assessment of Needs Other assessment/ plan:   FL2 update   Information/referral to community resources:   [facility]    PATIENT'S/FAMILY'S RESPONSE TO PLAN OF CARE: Patients son is agreeable to patient return to [facility] and is glad to hear the patient is doing well enough to leave.       Merlyn LotJenna Holoman, LCSWA Clinical Social Worker [phone]

## 2013-11-19 NOTE — Progress Notes (Signed)
D/c  Back to SNF via care link.  Report call to [phone] spoke with Steward DroneBrenda RN that would be receiving patient at facility. Pain assesment negative. Breathing regular and unlabored on room air.

## 2013-11-19 NOTE — Discharge Instructions (Signed)
Please follow up with primary care doctor in 1-2 weeks

## 2013-12-15 ENCOUNTER — Encounter: Payer: Self-pay | Admitting: Internal Medicine

## 2014-01-14 ENCOUNTER — Encounter: Payer: Self-pay | Admitting: Internal Medicine

## 2014-02-14 ENCOUNTER — Encounter: Payer: Self-pay | Admitting: Internal Medicine

## 2014-03-17 ENCOUNTER — Encounter: Payer: Self-pay | Admitting: Internal Medicine

## 2014-04-15 ENCOUNTER — Encounter
Admit: 2014-04-15 | Disposition: A | Discharge status: Home / Self Care | Payer: Self-pay | Attending: Internal Medicine | Admitting: Internal Medicine

## 2014-05-16 ENCOUNTER — Encounter
Admit: 2014-05-16 | Disposition: A | Discharge status: Home / Self Care | Payer: Self-pay | Attending: Internal Medicine | Admitting: Internal Medicine

## 2014-06-07 NOTE — Discharge Summary (Signed)
PATIENT NAMSherral Edwards:  Abbasi, Starlett MR#:  [id number] DATE OF BIRTH:  Mar 16, 1923  DATE OF ADMISSION:  06/26/2013 DATE OF DISCHARGE:  06/27/2013  DISCHARGE DIAGNOSES: 1. Acute on chronic blood loss anemia that was symptomatic. 2.  Severe dementia.  3. Urinary tract infection.   DISCHARGE MEDICATIONS: [facility]. Please see for details. Briefly, she will be off her aspirin now, as well as off the Aricept and nonessential pills. She will also take Cipro 500 b.i.d. for a week pending further urine cultures.   HISTORY AND PHYSICAL: Please see detailed history and physical done on admission.   HOSPITAL COURSE: The patient was admitted with symptomatic anemia. Blood pressure lower.  She was transfused. Her hemoglobin is up 7.9 today. Discussed with son, did not want further work-up and intervention given her severe end-stage dementia. However, we will try to treat her as well as we can with the  ppi, stopping her aspirin. Hopefully, she will improve with that. Her MCV was low. Iron studies are pending. Arrangements for transport,  etc. will be done today. Can use p.r.n. O2 nasal cannula if necessary.   It approximately 35 minutes to do all discharge tasks.  ____________________________ Marya AmslerMarshall W. Dareen PianoAnderson, MD mwa:sg D: 06/27/2013 07:50:29 ET T: 06/27/2013 08:16:46 ET JOB#: [id number]  cc: Marya AmslerMarshall W. Dareen PianoAnderson, MD, <Dictator> Lauro RegulusMARSHALL W ANDERSON MD ELECTRONICALLY SIGNED 06/28/2013 7:18

## 2014-06-07 NOTE — H&P (Signed)
PATIENT NAMSherral Edwards:  Edwards, Melissia MR#:  [id number] DATE OF BIRTH:  October 02, 1923  DATE OF ADMISSION:  06/26/2013  PRIMARY CARE PHYSICIAN: Marya AmslerMarshall W. Dareen PianoAnderson, MD, at [facility].  REFERRING EMERGENCY ROOM PHYSICIAN: Darien Ramusavid W. Kaminski, MD  CHIEF COMPLAINT: Low hemoglobin.   HISTORY OF PRESENT ILLNESS: An 81 year old female who is resident of [facility], has advanced dementia and needs support with feeding day-to-day but able to walk with a walker over there. As a routine, they did blood work and found her hemoglobin low so spoke to her son. They also found that she is having somewhat shortness of breath and so decided to send her to Emergency Room after speaking to the son. In ER, ER physician did the blood work and found hemoglobin 6.7 but her oxygen level was fine. There was some problem on keeping the sensor on the finger and was not detecting properly but when he placed it properly it came up to 98% so she does not require any oxygen. Because of severe dementia, the patient does not complain for anything but her UA was checked and it was positive with more than 700 WBCs so the ER physician started on IV Rocephin and ordered blood transfusion and given to hospitalist team for further management. During my examination, the patient's son is also present in the room, who drove from [place], and he says that other than this information which we have, there is not much to offer to us from him as she is doing fairly okay, walking with a walker at nursing home.   REVIEW OF SYSTEMS: Unable to get as the patient has dementia.   PAST MEDICAL HISTORY: 1.  Hypothyroidism.  2.  Depression.  3.  Anemia.  4.  Hypertension.  5.  Cerebrovascular accident.  6.  Hyperlipidemia.  7.  Dementia.   SOCIAL HISTORY: Lives [facility]. No smoking, no alcohol, no drug use and was walking with a walker.   FAMILY HISTORY: Mother lived up to 1894 and not had any major medical issues. Her  biological  father is unknown so does not know any history.   HOME MEDICATIONS:  1.  Vitamin C 500 mg oral once a day.  2.  Prilosec 20 mg oral take 2 tablets once a day.  3.  Pravastatin 80 mg oral once a day.  4.  Levothyroxine 137 mcg oral once a day.  5.  Hyoscyamine 0.375 mg oral once a day.  6.  Ferrous sulfate 325 mg oral 3 times a day.  7.  Donepezil 10 mg oral once a day.  8.  Cyanocobalamin 1000 mcg oral tablet 2 tablets once a day.  9.  Calcium carbonate 1000 mg oral tablet chewable with meals 3 times a day.  10.  Aspirin 325 mg oral once a day.   PHYSICAL EXAMINATION: VITAL SIGNS: In ER, temperature 97.7, pulse is 86, respirations 18, blood pressure 128/73. Pulse oximetry is 88% on room air and later on checked 93% on room air.  GENERAL: The patient is alert but not oriented due to terminal dementia.  HEENT: Head and neck atraumatic. Conjunctivae pale. Oral mucosa moist.  NECK: Supple. No JVD.  RESPIRATORY: Bilateral equal and clear air entry. No crepitations or rhonchi heard. Trachea central, midline.  CARDIOVASCULAR: S1, S2 present, regular. No murmur.  ABDOMEN: Soft, nontender. Bowel sounds present. No organomegaly.  SKIN: No rashes or lesions.  MUSCULOSKELETAL: No pain or swelling in the joints.  NEUROLOGICAL: Power 3 out of 5.  Moves all 4 limbs but generalized weakness. No tremor or rigidity.  SKIN: Color very pale.  PSYCHIATRIC: Unable to check because of terminal dementia.   LABORATORY, DIAGNOSTIC AND RADIOLOGICAL DATA:  Chest x-ray, portable, enlargement of cardiac silhouette, moderate-sized hiatal hernia, no acute abnormality. Glucose 140, BUN 15, creatinine 1.30, sodium 137, potassium 4.1, chloride is 104, CO2 is 21, total protein is 8, albumin is 3.2, bilirubin 0.4, alkaline phosphate 94, SGOT 19 and SGPT 10. WBC is 10.2, hemoglobin 6.7, platelet count is 298, MCV 62. Urinalysis is positive with 3+ leukocyte esterase, 741 WBC and 3+ bacteria.   ASSESSMENT AND PLAN: An  81 year old female with terminal dementia, hypothyroidism, depression, hypertension and history of stroke, sent to Emergency Room from nursing home as found having anemia according to routine blood test with some complaint of shortness of breath.  1.  Severe  anemia, most likely it looks like iron deficiency anemia as stool is negative for heme as per Emergency Room physician's rectal examination. We will give blood transfusion. Blood transfusion is ordered by ER physician already. Spoke to patient's son, who is okay transfusion. I explained about the side effects including reactions, infections and damage to kidney. He understands and agreed to receive blood transfusion.  2.  Urinary tract infection. We will give IV Rocephin and check urine culture.  3.  Hypothyroidism. Continue levothyroxine.  4.  Hyperlipidemia. Continue pravastatin  5.  Severe dementia. Continue Aricept.  6.  CODE STATUS is DO NOT RESUSCITATE as paper provided from nursing home.   TOTAL TIME SPENT ON THIS ADMISSION: 50 minutes.   ____________________________ Hope Pigeon Elisabeth Pigeon, MD vgv:cs D: 06/26/2013 21:05:36 ET T: 06/26/2013 21:18:31 ET JOB#: [id number]  cc: Hope Pigeon. Elisabeth Pigeon, MD, <Dictator> Marya Amsler. Dareen Piano, MD Altamese Dilling MD ELECTRONICALLY SIGNED 07/09/2013 16:30

## 2014-09-15 ENCOUNTER — Encounter
Admission: RE | Admit: 2014-09-15 | Discharge: 2014-09-15 | Disposition: A | Discharge status: Home / Self Care | Payer: Medicare Other | Source: Ambulatory Visit | Attending: Internal Medicine | Admitting: Internal Medicine

## 2014-09-15 DIAGNOSIS — I10 Essential (primary) hypertension: Secondary | ICD-10-CM | POA: Insufficient documentation

## 2014-09-23 DIAGNOSIS — I10 Essential (primary) hypertension: Secondary | ICD-10-CM | POA: Diagnosis not present

## 2014-09-23 LAB — COMPREHENSIVE METABOLIC PANEL
ALBUMIN: 3.6 g/dL (ref 3.5–5.0)
ALT: 9 U/L — ABNORMAL LOW (ref 14–54)
ANION GAP: 8 (ref 5–15)
AST: 18 U/L (ref 15–41)
Alkaline Phosphatase: 78 U/L (ref 38–126)
BUN: 19 mg/dL (ref 6–20)
CO2: 27 mmol/L (ref 22–32)
Calcium: 9.2 mg/dL (ref 8.9–10.3)
Chloride: 106 mmol/L (ref 101–111)
Creatinine, Ser: 0.99 mg/dL (ref 0.44–1.00)
GFR, EST AFRICAN AMERICAN: 57 mL/min — AB (ref >60)
GFR, EST NON AFRICAN AMERICAN: 49 mL/min — AB (ref >60)
Glucose, Bld: 97 mg/dL (ref 65–99)
POTASSIUM: 4.3 mmol/L (ref 3.5–5.1)
Sodium: 141 mmol/L (ref 135–145)
TOTAL PROTEIN: 6.9 g/dL (ref 6.5–8.1)
Total Bilirubin: 0.3 mg/dL (ref 0.3–1.2)

## 2014-09-23 LAB — CBC WITH DIFFERENTIAL/PLATELET
BASOS PCT: 2 %
Basophils Absolute: 0.1 10*3/uL (ref 0–0.1)
Eosinophils Absolute: 0.6 10*3/uL (ref 0–0.7)
Eosinophils Relative: 9 %
HEMATOCRIT: 37 % (ref 35.0–47.0)
HEMOGLOBIN: 12.2 g/dL (ref 12.0–16.0)
LYMPHS ABS: 1.4 10*3/uL (ref 1.0–3.6)
Lymphocytes Relative: 22 %
MCH: 28.2 pg (ref 26.0–34.0)
MCHC: 32.9 g/dL (ref 32.0–36.0)
MCV: 85.8 fL (ref 80.0–100.0)
MONOS PCT: 11 %
Monocytes Absolute: 0.7 10*3/uL (ref 0.2–0.9)
NEUTROS ABS: 3.5 10*3/uL (ref 1.4–6.5)
Neutrophils Relative %: 56 %
PLATELETS: 252 10*3/uL (ref 150–440)
RBC: 4.31 MIL/uL (ref 3.80–5.20)
RDW: 14.6 % — AB (ref 11.5–14.5)
WBC: 6.2 10*3/uL (ref 3.6–11.0)

## 2014-09-23 LAB — TSH: TSH: 0.534 u[IU]/mL (ref 0.350–4.500)

## 2014-09-23 LAB — LIPID PANEL
CHOLESTEROL: 194 mg/dL (ref 0–200)
HDL: 49 mg/dL (ref >40)
LDL Cholesterol: 117 mg/dL — ABNORMAL HIGH (ref 0–99)
TRIGLYCERIDES: 142 mg/dL (ref <150)
Total CHOL/HDL Ratio: 4 RATIO
VLDL: 28 mg/dL (ref 0–40)

## 2014-09-23 LAB — VITAMIN B12: Vitamin B-12: 1467 pg/mL — ABNORMAL HIGH (ref 180–914)

## 2014-10-16 ENCOUNTER — Encounter
Admission: RE | Admit: 2014-10-16 | Discharge: 2014-10-16 | Disposition: A | Discharge status: Home / Self Care | Payer: Medicare Other | Source: Ambulatory Visit | Attending: Internal Medicine | Admitting: Internal Medicine

## 2014-10-16 DIAGNOSIS — E039 Hypothyroidism, unspecified: Secondary | ICD-10-CM | POA: Insufficient documentation

## 2014-11-04 DIAGNOSIS — E039 Hypothyroidism, unspecified: Secondary | ICD-10-CM | POA: Diagnosis not present

## 2014-11-04 LAB — TSH: TSH: 1.649 u[IU]/mL (ref 0.350–4.500)

## 2014-11-15 ENCOUNTER — Encounter
Admission: RE | Admit: 2014-11-15 | Discharge: 2014-11-15 | Disposition: A | Discharge status: Home / Self Care | Payer: Medicare Other | Source: Ambulatory Visit | Attending: Internal Medicine | Admitting: Internal Medicine

## 2014-12-16 ENCOUNTER — Encounter
Admission: RE | Admit: 2014-12-16 | Discharge: 2014-12-16 | Disposition: A | Discharge status: Home / Self Care | Payer: Medicare Other | Source: Ambulatory Visit | Attending: Internal Medicine | Admitting: Internal Medicine

## 2015-01-15 ENCOUNTER — Encounter
Admission: RE | Admit: 2015-01-15 | Discharge: 2015-01-15 | Disposition: A | Discharge status: Home / Self Care | Payer: Medicare Other | Source: Ambulatory Visit | Attending: Internal Medicine | Admitting: Internal Medicine

## 2015-02-15 ENCOUNTER — Encounter
Admission: RE | Admit: 2015-02-15 | Discharge: 2015-02-15 | Disposition: A | Discharge status: Home / Self Care | Payer: Medicare Other | Source: Ambulatory Visit | Attending: Internal Medicine | Admitting: Internal Medicine

## 2015-03-18 ENCOUNTER — Encounter
Admission: RE | Admit: 2015-03-18 | Discharge: 2015-03-18 | Disposition: A | Discharge status: Home / Self Care | Payer: Medicare Other | Source: Ambulatory Visit | Attending: Internal Medicine | Admitting: Internal Medicine

## 2015-04-15 ENCOUNTER — Encounter
Admission: RE | Admit: 2015-04-15 | Discharge: 2015-04-15 | Disposition: A | Discharge status: Home / Self Care | Payer: Medicare Other | Source: Ambulatory Visit | Attending: Internal Medicine | Admitting: Internal Medicine

## 2015-05-16 ENCOUNTER — Encounter
Admission: RE | Admit: 2015-05-16 | Discharge: 2015-05-16 | Disposition: A | Discharge status: Home / Self Care | Payer: Medicare Other | Source: Ambulatory Visit | Attending: Internal Medicine | Admitting: Internal Medicine

## 2015-05-16 DIAGNOSIS — D649 Anemia, unspecified: Secondary | ICD-10-CM | POA: Insufficient documentation

## 2015-05-16 DIAGNOSIS — E7800 Pure hypercholesterolemia, unspecified: Secondary | ICD-10-CM | POA: Insufficient documentation

## 2015-05-16 DIAGNOSIS — E039 Hypothyroidism, unspecified: Secondary | ICD-10-CM | POA: Insufficient documentation

## 2015-05-16 DIAGNOSIS — I10 Essential (primary) hypertension: Secondary | ICD-10-CM | POA: Insufficient documentation

## 2015-05-19 DIAGNOSIS — I10 Essential (primary) hypertension: Secondary | ICD-10-CM | POA: Diagnosis not present

## 2015-05-19 DIAGNOSIS — D649 Anemia, unspecified: Secondary | ICD-10-CM | POA: Diagnosis not present

## 2015-05-19 DIAGNOSIS — E039 Hypothyroidism, unspecified: Secondary | ICD-10-CM | POA: Diagnosis not present

## 2015-05-19 DIAGNOSIS — E7800 Pure hypercholesterolemia, unspecified: Secondary | ICD-10-CM | POA: Diagnosis not present

## 2015-05-19 LAB — TSH: TSH: 3.589 u[IU]/mL (ref 0.350–4.500)

## 2015-05-19 LAB — COMPREHENSIVE METABOLIC PANEL
ALBUMIN: 3.6 g/dL (ref 3.5–5.0)
ALT: 16 U/L (ref 14–54)
ANION GAP: 5 (ref 5–15)
AST: 21 U/L (ref 15–41)
Alkaline Phosphatase: 81 U/L (ref 38–126)
BILIRUBIN TOTAL: 0.7 mg/dL (ref 0.3–1.2)
BUN: 28 mg/dL — ABNORMAL HIGH (ref 6–20)
CO2: 26 mmol/L (ref 22–32)
Calcium: 9.6 mg/dL (ref 8.9–10.3)
Chloride: 110 mmol/L (ref 101–111)
Creatinine, Ser: 1.23 mg/dL — ABNORMAL HIGH (ref 0.44–1.00)
GFR calc Af Amer: 44 mL/min — ABNORMAL LOW (ref >60)
GFR, EST NON AFRICAN AMERICAN: 38 mL/min — AB (ref >60)
GLUCOSE: 110 mg/dL — AB (ref 65–99)
POTASSIUM: 4.2 mmol/L (ref 3.5–5.1)
Sodium: 141 mmol/L (ref 135–145)
TOTAL PROTEIN: 7.1 g/dL (ref 6.5–8.1)

## 2015-05-19 LAB — LIPID PANEL
CHOL/HDL RATIO: 4 ratio
CHOLESTEROL: 174 mg/dL (ref 0–200)
HDL: 43 mg/dL (ref >40)
LDL Cholesterol: 92 mg/dL (ref 0–99)
Triglycerides: 193 mg/dL — ABNORMAL HIGH (ref <150)
VLDL: 39 mg/dL (ref 0–40)

## 2015-05-19 LAB — CBC WITH DIFFERENTIAL/PLATELET
BASOS PCT: 1 %
Basophils Absolute: 0.1 10*3/uL (ref 0–0.1)
EOS PCT: 4 %
Eosinophils Absolute: 0.4 10*3/uL (ref 0–0.7)
HEMATOCRIT: 42.3 % (ref 35.0–47.0)
Hemoglobin: 14 g/dL (ref 12.0–16.0)
Lymphocytes Relative: 20 %
Lymphs Abs: 1.7 10*3/uL (ref 1.0–3.6)
MCH: 29.2 pg (ref 26.0–34.0)
MCHC: 33 g/dL (ref 32.0–36.0)
MCV: 88.6 fL (ref 80.0–100.0)
MONO ABS: 0.7 10*3/uL (ref 0.2–0.9)
MONOS PCT: 8 %
NEUTROS ABS: 5.8 10*3/uL (ref 1.4–6.5)
Neutrophils Relative %: 67 %
Platelets: 237 10*3/uL (ref 150–440)
RBC: 4.78 MIL/uL (ref 3.80–5.20)
RDW: 16 % — AB (ref 11.5–14.5)
WBC: 8.8 10*3/uL (ref 3.6–11.0)

## 2015-05-19 LAB — MAGNESIUM: Magnesium: 2.1 mg/dL (ref 1.7–2.4)

## 2015-05-19 LAB — VITAMIN B12: Vitamin B-12: 2753 pg/mL — ABNORMAL HIGH (ref 180–914)

## 2015-05-20 LAB — VITAMIN D 25 HYDROXY (VIT D DEFICIENCY, FRACTURES): Vit D, 25-Hydroxy: 16.1 ng/mL — ABNORMAL LOW (ref 30.0–100.0)

## 2015-06-15 ENCOUNTER — Encounter
Admission: RE | Admit: 2015-06-15 | Discharge: 2015-06-15 | Disposition: A | Discharge status: Home / Self Care | Payer: Medicare Other | Source: Ambulatory Visit | Attending: Internal Medicine | Admitting: Internal Medicine

## 2015-07-16 ENCOUNTER — Encounter
Admission: RE | Admit: 2015-07-16 | Discharge: 2015-07-16 | Disposition: A | Discharge status: Home / Self Care | Payer: Medicare Other | Source: Ambulatory Visit | Attending: Internal Medicine | Admitting: Internal Medicine

## 2015-08-15 ENCOUNTER — Encounter
Admission: RE | Admit: 2015-08-15 | Discharge: 2015-08-15 | Disposition: A | Discharge status: Home / Self Care | Payer: Medicare Other | Source: Ambulatory Visit | Attending: Internal Medicine | Admitting: Internal Medicine

## 2015-08-17 ENCOUNTER — Other Ambulatory Visit
Admission: RE | Admit: 2015-08-17 | Discharge: 2015-08-17 | Disposition: A | Discharge status: Home / Self Care | Payer: Medicare Other | Source: Skilled Nursing Facility | Attending: Internal Medicine | Admitting: Internal Medicine

## 2015-08-17 DIAGNOSIS — R4182 Altered mental status, unspecified: Secondary | ICD-10-CM | POA: Insufficient documentation

## 2015-08-17 DIAGNOSIS — R509 Fever, unspecified: Secondary | ICD-10-CM | POA: Insufficient documentation

## 2015-08-17 LAB — CBC WITH DIFFERENTIAL/PLATELET
Basophils Absolute: 0.1 10*3/uL (ref 0–0.1)
Basophils Relative: 0 %
Eosinophils Absolute: 0 10*3/uL (ref 0–0.7)
Eosinophils Relative: 0 %
HEMATOCRIT: 39.7 % (ref 35.0–47.0)
Hemoglobin: 13.2 g/dL (ref 12.0–16.0)
LYMPHS PCT: 3 %
Lymphs Abs: 0.6 10*3/uL — ABNORMAL LOW (ref 1.0–3.6)
MCH: 29.9 pg (ref 26.0–34.0)
MCHC: 33.2 g/dL (ref 32.0–36.0)
MCV: 90.3 fL (ref 80.0–100.0)
MONOS PCT: 3 %
Monocytes Absolute: 0.6 10*3/uL (ref 0.2–0.9)
NEUTROS PCT: 94 %
Neutro Abs: 19.2 10*3/uL — ABNORMAL HIGH (ref 1.4–6.5)
Platelets: 255 10*3/uL (ref 150–440)
RBC: 4.4 MIL/uL (ref 3.80–5.20)
RDW: 14.9 % — ABNORMAL HIGH (ref 11.5–14.5)
WBC: 20.4 10*3/uL — AB (ref 3.6–11.0)

## 2015-08-17 LAB — COMPREHENSIVE METABOLIC PANEL
ALK PHOS: 80 U/L (ref 38–126)
ALT: 11 U/L — ABNORMAL LOW (ref 14–54)
ANION GAP: 11 (ref 5–15)
AST: 17 U/L (ref 15–41)
Albumin: 3.1 g/dL — ABNORMAL LOW (ref 3.5–5.0)
BUN: 34 mg/dL — ABNORMAL HIGH (ref 6–20)
CALCIUM: 9.9 mg/dL (ref 8.9–10.3)
CHLORIDE: 115 mmol/L — AB (ref 101–111)
CO2: 21 mmol/L — AB (ref 22–32)
Creatinine, Ser: 1.33 mg/dL — ABNORMAL HIGH (ref 0.44–1.00)
GFR calc non Af Amer: 34 mL/min — ABNORMAL LOW (ref >60)
GFR, EST AFRICAN AMERICAN: 40 mL/min — AB (ref >60)
Glucose, Bld: 119 mg/dL — ABNORMAL HIGH (ref 65–99)
POTASSIUM: 3.4 mmol/L — AB (ref 3.5–5.1)
SODIUM: 147 mmol/L — AB (ref 135–145)
Total Bilirubin: 0.5 mg/dL (ref 0.3–1.2)
Total Protein: 7.2 g/dL (ref 6.5–8.1)

## 2015-08-17 LAB — URINALYSIS COMPLETE WITH MICROSCOPIC (ARMC ONLY)
Bilirubin Urine: NEGATIVE
Glucose, UA: NEGATIVE mg/dL
Ketones, ur: NEGATIVE mg/dL
Nitrite: NEGATIVE
PH: 5 (ref 5.0–8.0)
PROTEIN: 100 mg/dL — AB
SPECIFIC GRAVITY, URINE: 1.026 (ref 1.005–1.030)

## 2015-08-18 ENCOUNTER — Inpatient Hospital Stay
Admission: EM | Admit: 2015-08-18 | Discharge: 2015-08-20 | DRG: 871 | Payer: Medicare Other | Attending: Internal Medicine | Admitting: Internal Medicine

## 2015-08-18 ENCOUNTER — Emergency Department: Payer: Medicare Other

## 2015-08-18 ENCOUNTER — Non-Acute Institutional Stay (SKILLED_NURSING_FACILITY): Payer: Medicare Other | Admitting: Gerontology

## 2015-08-18 ENCOUNTER — Encounter: Payer: Self-pay | Admitting: Emergency Medicine

## 2015-08-18 DIAGNOSIS — I10 Essential (primary) hypertension: Secondary | ICD-10-CM | POA: Diagnosis present

## 2015-08-18 DIAGNOSIS — E46 Unspecified protein-calorie malnutrition: Secondary | ICD-10-CM | POA: Diagnosis present

## 2015-08-18 DIAGNOSIS — K219 Gastro-esophageal reflux disease without esophagitis: Secondary | ICD-10-CM | POA: Diagnosis present

## 2015-08-18 DIAGNOSIS — I248 Other forms of acute ischemic heart disease: Secondary | ICD-10-CM | POA: Diagnosis present

## 2015-08-18 DIAGNOSIS — F0390 Unspecified dementia without behavioral disturbance: Secondary | ICD-10-CM | POA: Diagnosis present

## 2015-08-18 DIAGNOSIS — N390 Urinary tract infection, site not specified: Secondary | ICD-10-CM | POA: Diagnosis present

## 2015-08-18 DIAGNOSIS — R4182 Altered mental status, unspecified: Secondary | ICD-10-CM

## 2015-08-18 DIAGNOSIS — J189 Pneumonia, unspecified organism: Secondary | ICD-10-CM | POA: Diagnosis present

## 2015-08-18 DIAGNOSIS — I4891 Unspecified atrial fibrillation: Secondary | ICD-10-CM | POA: Diagnosis present

## 2015-08-18 DIAGNOSIS — Z9049 Acquired absence of other specified parts of digestive tract: Secondary | ICD-10-CM | POA: Diagnosis not present

## 2015-08-18 DIAGNOSIS — Z66 Do not resuscitate: Secondary | ICD-10-CM | POA: Diagnosis present

## 2015-08-18 DIAGNOSIS — R000 Tachycardia, unspecified: Secondary | ICD-10-CM | POA: Diagnosis not present

## 2015-08-18 DIAGNOSIS — E876 Hypokalemia: Secondary | ICD-10-CM | POA: Diagnosis present

## 2015-08-18 DIAGNOSIS — M1990 Unspecified osteoarthritis, unspecified site: Secondary | ICD-10-CM | POA: Diagnosis present

## 2015-08-18 DIAGNOSIS — N179 Acute kidney failure, unspecified: Secondary | ICD-10-CM | POA: Diagnosis present

## 2015-08-18 DIAGNOSIS — E870 Hyperosmolality and hypernatremia: Secondary | ICD-10-CM | POA: Diagnosis present

## 2015-08-18 DIAGNOSIS — Z8673 Personal history of transient ischemic attack (TIA), and cerebral infarction without residual deficits: Secondary | ICD-10-CM

## 2015-08-18 DIAGNOSIS — A419 Sepsis, unspecified organism: Principal | ICD-10-CM | POA: Diagnosis present

## 2015-08-18 DIAGNOSIS — E860 Dehydration: Secondary | ICD-10-CM | POA: Diagnosis present

## 2015-08-18 DIAGNOSIS — E039 Hypothyroidism, unspecified: Secondary | ICD-10-CM | POA: Diagnosis present

## 2015-08-18 DIAGNOSIS — Z96651 Presence of right artificial knee joint: Secondary | ICD-10-CM | POA: Diagnosis present

## 2015-08-18 DIAGNOSIS — G9341 Metabolic encephalopathy: Secondary | ICD-10-CM | POA: Diagnosis present

## 2015-08-18 DIAGNOSIS — Z888 Allergy status to other drugs, medicaments and biological substances status: Secondary | ICD-10-CM

## 2015-08-18 LAB — COMPREHENSIVE METABOLIC PANEL
ALK PHOS: 78 U/L (ref 38–126)
ALT: 13 U/L — AB (ref 14–54)
ANION GAP: 9 (ref 5–15)
AST: 17 U/L (ref 15–41)
Albumin: 2.7 g/dL — ABNORMAL LOW (ref 3.5–5.0)
BILIRUBIN TOTAL: 0.6 mg/dL (ref 0.3–1.2)
BUN: 42 mg/dL — ABNORMAL HIGH (ref 6–20)
CALCIUM: 10 mg/dL (ref 8.9–10.3)
CO2: 22 mmol/L (ref 22–32)
CREATININE: 1.31 mg/dL — AB (ref 0.44–1.00)
Chloride: 119 mmol/L — ABNORMAL HIGH (ref 101–111)
GFR, EST AFRICAN AMERICAN: 41 mL/min — AB (ref >60)
GFR, EST NON AFRICAN AMERICAN: 35 mL/min — AB (ref >60)
Glucose, Bld: 130 mg/dL — ABNORMAL HIGH (ref 65–99)
Potassium: 3.3 mmol/L — ABNORMAL LOW (ref 3.5–5.1)
SODIUM: 150 mmol/L — AB (ref 135–145)
TOTAL PROTEIN: 7.1 g/dL (ref 6.5–8.1)

## 2015-08-18 LAB — URINALYSIS COMPLETE WITH MICROSCOPIC (ARMC ONLY)
Bilirubin Urine: NEGATIVE
Glucose, UA: NEGATIVE mg/dL
KETONES UR: NEGATIVE mg/dL
Nitrite: NEGATIVE
PH: 5 (ref 5.0–8.0)
Protein, ur: 100 mg/dL — AB
Specific Gravity, Urine: 1.024 (ref 1.005–1.030)
Trans Epithel, UA: 6

## 2015-08-18 LAB — CBC WITH DIFFERENTIAL/PLATELET
BASOS ABS: 0 10*3/uL (ref 0–0.1)
Basophils Relative: 0 %
EOS ABS: 0 10*3/uL (ref 0–0.7)
EOS PCT: 0 %
HCT: 39.7 % (ref 35.0–47.0)
Hemoglobin: 13.5 g/dL (ref 12.0–16.0)
Lymphocytes Relative: 3 %
Lymphs Abs: 0.5 10*3/uL — ABNORMAL LOW (ref 1.0–3.6)
MCH: 30.5 pg (ref 26.0–34.0)
MCHC: 34 g/dL (ref 32.0–36.0)
MCV: 89.5 fL (ref 80.0–100.0)
MONO ABS: 0.6 10*3/uL (ref 0.2–0.9)
Monocytes Relative: 3 %
Neutro Abs: 18.1 10*3/uL — ABNORMAL HIGH (ref 1.4–6.5)
Neutrophils Relative %: 94 %
PLATELETS: 271 10*3/uL (ref 150–440)
RBC: 4.43 MIL/uL (ref 3.80–5.20)
RDW: 15.4 % — AB (ref 11.5–14.5)
WBC: 19.2 10*3/uL — AB (ref 3.6–11.0)

## 2015-08-18 LAB — TROPONIN I
TROPONIN I: 0.09 ng/mL — AB (ref <0.03)
Troponin I: 0.09 ng/mL (ref <0.03)
Troponin I: 0.32 ng/mL (ref <0.03)

## 2015-08-18 LAB — LACTIC ACID, PLASMA
LACTIC ACID, VENOUS: 1.2 mmol/L (ref 0.5–1.9)
Lactic Acid, Venous: 1.2 mmol/L (ref 0.5–1.9)

## 2015-08-18 LAB — SODIUM: SODIUM: 150 mmol/L — AB (ref 135–145)

## 2015-08-18 MED ORDER — VANCOMYCIN HCL 500 MG IV SOLR
500.0000 mg | INTRAVENOUS | Status: DC
  Administered 2015-08-19 – 2015-08-20 (×2): 500 mg via INTRAVENOUS
  Filled 2015-08-18 (×2): qty 500

## 2015-08-18 MED ORDER — SODIUM CHLORIDE 0.9 % IV BOLUS (SEPSIS)
500.0000 mL | Freq: Once | INTRAVENOUS | Status: AC
  Administered 2015-08-18: 500 mL via INTRAVENOUS

## 2015-08-18 MED ORDER — CITALOPRAM HYDROBROMIDE 10 MG PO TABS
5.0000 mg | ORAL_TABLET | Freq: Every day | ORAL | Status: DC
  Administered 2015-08-18 – 2015-08-20 (×3): 5 mg via ORAL
  Filled 2015-08-18 (×3): qty 1

## 2015-08-18 MED ORDER — TOPIRAMATE 25 MG PO TABS
50.0000 mg | ORAL_TABLET | Freq: Two times a day (BID) | ORAL | Status: DC
  Administered 2015-08-18 – 2015-08-20 (×4): 50 mg via ORAL
  Filled 2015-08-18 (×4): qty 2

## 2015-08-18 MED ORDER — PRAVASTATIN SODIUM 40 MG PO TABS
80.0000 mg | ORAL_TABLET | Freq: Every day | ORAL | Status: DC
  Administered 2015-08-18 – 2015-08-20 (×3): 80 mg via ORAL
  Filled 2015-08-18 (×3): qty 2

## 2015-08-18 MED ORDER — PIPERACILLIN-TAZOBACTAM 3.375 G IVPB 30 MIN
3.3750 g | Freq: Once | INTRAVENOUS | Status: AC
  Administered 2015-08-18: 3.375 g via INTRAVENOUS
  Filled 2015-08-18: qty 50

## 2015-08-18 MED ORDER — PIPERACILLIN-TAZOBACTAM 3.375 G IVPB
3.3750 g | Freq: Three times a day (TID) | INTRAVENOUS | Status: DC
  Administered 2015-08-18 – 2015-08-20 (×6): 3.375 g via INTRAVENOUS
  Filled 2015-08-18 (×8): qty 50

## 2015-08-18 MED ORDER — IPRATROPIUM-ALBUTEROL 0.5-2.5 (3) MG/3ML IN SOLN: 3.0000 mL | Freq: Four times a day (QID) | RESPIRATORY_TRACT | Status: DC | PRN

## 2015-08-18 MED ORDER — GUAIFENESIN 100 MG/5ML PO SYRP
200.0000 mg | ORAL_SOLUTION | ORAL | Status: DC | PRN
  Filled 2015-08-18: qty 10

## 2015-08-18 MED ORDER — HEPARIN SODIUM (PORCINE) 5000 UNIT/ML IJ SOLN
5000.0000 [IU] | Freq: Three times a day (TID) | INTRAMUSCULAR | Status: DC
  Administered 2015-08-18 – 2015-08-19 (×2): 5000 [IU] via SUBCUTANEOUS
  Filled 2015-08-18 (×2): qty 1

## 2015-08-18 MED ORDER — ACETAMINOPHEN 325 MG PO TABS
650.0000 mg | ORAL_TABLET | ORAL | Status: DC | PRN
Start: 2015-08-18 — End: 2015-08-20

## 2015-08-18 MED ORDER — SODIUM CHLORIDE 0.9 % IV BOLUS (SEPSIS)
1000.0000 mL | Freq: Once | INTRAVENOUS | Status: AC
  Administered 2015-08-18: 1000 mL via INTRAVENOUS

## 2015-08-18 MED ORDER — TRAZODONE HCL 50 MG PO TABS: 25.0000 mg | ORAL_TABLET | Freq: Every evening | ORAL | Status: DC | PRN

## 2015-08-18 MED ORDER — VANCOMYCIN HCL IN DEXTROSE 1-5 GM/200ML-% IV SOLN
1000.0000 mg | Freq: Once | INTRAVENOUS | Status: AC
  Administered 2015-08-18: 1000 mg via INTRAVENOUS
  Filled 2015-08-18: qty 200

## 2015-08-18 MED ORDER — SODIUM CHLORIDE 0.9% FLUSH
3.0000 mL | Freq: Two times a day (BID) | INTRAVENOUS | Status: DC
  Administered 2015-08-18 – 2015-08-20 (×4): 3 mL via INTRAVENOUS

## 2015-08-18 MED ORDER — LEVOTHYROXINE SODIUM 100 MCG PO TABS
100.0000 ug | ORAL_TABLET | Freq: Every day | ORAL | Status: DC
  Administered 2015-08-19 – 2015-08-20 (×2): 100 ug via ORAL
  Filled 2015-08-18 (×2): qty 1

## 2015-08-18 MED ORDER — SODIUM CHLORIDE 0.9 % IV SOLN
INTRAVENOUS | Status: DC
  Administered 2015-08-18 – 2015-08-19 (×2): via INTRAVENOUS

## 2015-08-18 MED ORDER — QUETIAPINE FUMARATE 25 MG PO TABS
25.0000 mg | ORAL_TABLET | Freq: Every day | ORAL | Status: DC
  Administered 2015-08-18 – 2015-08-20 (×3): 25 mg via ORAL
  Filled 2015-08-18 (×3): qty 1

## 2015-08-18 MED ORDER — PANTOPRAZOLE SODIUM 40 MG PO TBEC
40.0000 mg | DELAYED_RELEASE_TABLET | Freq: Every day | ORAL | Status: DC
  Administered 2015-08-19 – 2015-08-20 (×2): 40 mg via ORAL
  Filled 2015-08-18 (×2): qty 1

## 2015-08-18 MED ORDER — SODIUM CHLORIDE 0.9 % IV BOLUS (SEPSIS)
250.0000 mL | Freq: Once | INTRAVENOUS | Status: AC
  Administered 2015-08-18: 250 mL via INTRAVENOUS

## 2015-08-18 NOTE — ED Provider Notes (Signed)
[facility] Emergency Department Provider Note  Time seen: 1:35 PM  I have reviewed the triage vital signs and the nursing notes.   HISTORY  Chief Complaint Altered Mental Status    HPI Cathy Edwards is a 82 y.o. female with a past medical history of hypertension, TIA, hypothyroidism, dementia, arthritis who presents to the emergency department from her nursing facility for altered mental status. According to EMS reported they were called out for increased confusion and difficulty breathing. Upon arrival they found the patient to be hypoxic, febrile and altered. Per nursing home report the patient had a fever beginning yesterday, had a urinalysis showing bacteria. They state the patient has baseline dementia with confusion, but is usually more responsive then today. Patient is not able to give history, does not follow commands.     Past Medical History  Diagnosis Date  . Carotid stenosis   . Hypertension   . TIA (transient ischemic attack)   . Hypothyroidism   . Dementia   . Hx: UTI (urinary tract infection)   . Arthritis     RA    Patient Active Problem List   Diagnosis Date Noted  . Subdural hematoma (HCC) 11/18/2013  . 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 HTN (hypertension), benign 11/18/2013  . Hypothyroidism 11/18/2013  . SDH (subdural hematoma) (HCC) 11/18/2013    Past Surgical History  Procedure Laterality Date  . Total knee arthroplasty Right   . Appendectomy    . Cholecystectomy      Current Outpatient Rx  Name  Route  Sig  Dispense  Refill  . acetaminophen (TYLENOL) 325 MG tablet   Oral   Take 650 mg by mouth every 6 (six) hours as needed for moderate pain.         Marland Kitchen albuterol (PROVENTIL HFA;VENTOLIN HFA) 108 (90 BASE) MCG/ACT inhaler   Inhalation   Inhale 2 puffs into the lungs every 4 (four) hours as needed for wheezing or shortness of breath.         . calcium carbonate 1250 MG capsule    Oral   Take 2,500 mg by mouth 3 (three) times daily with meals.         . citalopram (CELEXA) 10 MG tablet   Oral   Take 5 mg by mouth daily.         Marland Kitchen guaifenesin (ROBITUSSIN) 100 MG/5ML syrup   Oral   Take 200 mg by mouth every 4 (four) hours as needed for cough.         . hyoscyamine (LEVBID) 0.375 MG 12 hr tablet   Oral   Take 0.375 mg by mouth daily.         Marland Kitchen levothyroxine (SYNTHROID, LEVOTHROID) 112 MCG tablet   Oral   Take 112 mcg by mouth daily before breakfast.         . mirtazapine (REMERON) 7.5 MG tablet   Oral   Take 7.5 mg by mouth at bedtime.         Marland Kitchen omeprazole (PRILOSEC) 20 MG capsule   Oral   Take 20 mg by mouth daily.         . pravastatin (PRAVACHOL) 80 MG tablet   Oral   Take 80 mg by mouth daily.         . vitamin B-12 (CYANOCOBALAMIN) 1000 MCG tablet   Oral   Take 2,000 mcg by mouth daily.           Allergies  Demerol and Epinephrine  History reviewed. No pertinent family history.  Social History Social History  Substance Use Topics  . Smoking status: Never Smoker   . Smokeless tobacco: Never Used  . Alcohol Use: No    Review of Systems Unable to obtain a review of systems secondary to altered mental status and dementia.  ____________________________________________   PHYSICAL EXAM:  VITAL SIGNS: ED Triage Vitals  Enc Vitals Group     BP 08/18/15 1327 144/99 mmHg     Pulse Rate 08/18/15 1327 116     Resp 08/18/15 1327 22     Temp --      Temp src --      SpO2 08/18/15 1327 96 %     Weight 08/18/15 1327 112 lb 6.4 oz (50.984 kg)     Height 08/18/15 1327 5\' 2"  (1.575 m)     Head Cir --      Peak Flow --      Pain Score --      Pain Loc --      Pain Edu? --      Excl. in GC? --     Constitutional: Alert. Eyes are open, will make eye contact but does not follow commands or answer questions. Does not appear to be in any acute distress. Eyes: Normal exam ENT   Head: Normocephalic and atraumatic.    Mouth/Throat: Dry mucous membranes. Cardiovascular: Normal rate, regular rhythm. Respiratory: Normal respiratory effort without tachypnea nor retractions. Breath sounds are clear. No wheeze, rales or rhonchi auscultated. Gastrointestinal: Soft and nontender. No distention.  No reaction to abdominal palpation. Musculoskeletal: No apparent trauma, moves all extremities. Neurologic:  Does not speak, moves all extremities spontaneously. Unable to perform an extensive neurologic exam due to altered mental status and dementia. Skin:  Skin is warm, dry and intact.  Psychiatric: Mood and affect are normal. Speech and behavior are normal  ____________________________________________    EKG  EKG reviewed and interpreted by myself appears to show sinus rhythm around 116 bpm, computer read as atrial fibrillation however the patient appears to have a P-wave before each QRS complex. Intervals are largely within normal limits. Nonspecific ST changes. No obvious ST elevation.  ____________________________________________    RADIOLOGY  X-ray consistent with right-sided pneumonia  ____________________________________________ CRITICAL CARE Performed by: Minna AntisPADUCHOWSKI, Emilina Smarr   Total critical care time: 45 minutes  Critical care time was exclusive of separately billable procedures and treating other patients.  Critical care was necessary to treat or prevent imminent or life-threatening deterioration.  Critical care was time spent personally by me on the following activities: development of treatment plan with patient and/or surrogate as well as nursing, discussions with consultants, evaluation of patient's response to treatment, examination of patient, obtaining history from patient or surrogate, ordering and performing treatments and interventions, ordering and review of laboratory studies, ordering and review of radiographic studies, pulse oximetry and re-evaluation of patient's  condition.    INITIAL IMPRESSION / ASSESSMENT AND PLAN / ED COURSE  Pertinent labs & imaging results that were available during my care of the patient were reviewed by me and considered in my medical decision making (see chart for details).  Patient presents the emergency department with altered mental status, she is febrile hypoxic in the 80s on room air, low 90s on 2 L nasal cannula. Per report has bacteria in her urine. Patient meets sepsis criteria based on temperature and tachycardia suspected infectious source. I have activated her sepsis protocols, we will continue to  closely monitor in the emergency department while awaiting lab and imaging results.  Urinalysis consistent with urinary tract infection. X-ray consistent with right-sided pneumonia. Patient will be admitted for sepsis treatment. Troponin slightly elevated likely due to demand ischemia. WBC of 19,000.  ____________________________________________   FINAL CLINICAL IMPRESSION(S) / ED DIAGNOSES  Sepsis Pneumonia Urinary tract infection  Minna AntisKevin Eliannah Hinde, MD 08/18/15 1428

## 2015-08-18 NOTE — H&P (Signed)
Cathy Edwards is an 82 y.o. female.   Chief Complaint: Fever and AMS HPI: Sent to Ed from [facility] because of fever and altered mental status. Found to be septic from pneumonia. Unable to obtain history from patient. Spoke with son who is POA over the phone. Patient has been declining in recent months. Does not verbally communicate at baseline.   Past Medical History  Diagnosis Date  . Carotid stenosis   . Hypertension   . TIA (transient ischemic attack)   . Hypothyroidism   . Dementia   . Hx: UTI (urinary tract infection)   . Arthritis     RA    Past Surgical History  Procedure Laterality Date  . Total knee arthroplasty Right   . Appendectomy    . Cholecystectomy      History reviewed. No pertinent family history. Social History:  reports that she has never smoked. She has never used smokeless tobacco. She reports that she does not drink alcohol or use illicit drugs.  Allergies:  Allergies  Allergen Reactions  . Demerol [Meperidine] Other (See Comments)    unknown  . Epinephrine Other (See Comments)    unknown     (Not in a hospital admission)  Results for orders placed or performed during the hospital encounter of 08/18/15 (from the past 48 hour(s))  Comprehensive metabolic panel     Status: Abnormal   Collection Time: 08/18/15  1:30 PM  Result Value Ref Range   Sodium 150 (H) 135 - 145 mmol/L   Potassium 3.3 (L) 3.5 - 5.1 mmol/L   Chloride 119 (H) 101 - 111 mmol/L   CO2 22 22 - 32 mmol/L   Glucose, Bld 130 (H) 65 - 99 mg/dL   BUN 42 (H) 6 - 20 mg/dL   Creatinine, Ser 1.31 (H) 0.44 - 1.00 mg/dL   Calcium 10.0 8.9 - 10.3 mg/dL   Total Protein 7.1 6.5 - 8.1 g/dL   Albumin 2.7 (L) 3.5 - 5.0 g/dL   AST 17 15 - 41 U/L   ALT 13 (L) 14 - 54 U/L   Alkaline Phosphatase 78 38 - 126 U/L   Total Bilirubin 0.6 0.3 - 1.2 mg/dL   GFR calc non Af Amer 35 (L) >60 mL/min   GFR calc Af Amer 41 (L) >60 mL/min    Comment: (NOTE) The eGFR has been calculated using  the CKD EPI equation. This calculation has not been validated in all clinical situations. eGFR's persistently <60 mL/min signify possible Chronic Kidney Disease.    Anion gap 9 5 - 15  Lactic acid, plasma     Status: None   Collection Time: 08/18/15  1:30 PM  Result Value Ref Range   Lactic Acid, Venous 1.2 0.5 - 1.9 mmol/L  CBC with Differential     Status: Abnormal   Collection Time: 08/18/15  1:30 PM  Result Value Ref Range   WBC 19.2 (H) 3.6 - 11.0 K/uL   RBC 4.43 3.80 - 5.20 MIL/uL   Hemoglobin 13.5 12.0 - 16.0 g/dL   HCT 39.7 35.0 - 47.0 %   MCV 89.5 80.0 - 100.0 fL   MCH 30.5 26.0 - 34.0 pg   MCHC 34.0 32.0 - 36.0 g/dL   RDW 15.4 (H) 11.5 - 14.5 %   Platelets 271 150 - 440 K/uL   Neutrophils Relative % 94 %   Neutro Abs 18.1 (H) 1.4 - 6.5 K/uL   Lymphocytes Relative 3 %   Lymphs Abs 0.5 (  L) 1.0 - 3.6 K/uL   Monocytes Relative 3 %   Monocytes Absolute 0.6 0.2 - 0.9 K/uL   Eosinophils Relative 0 %   Eosinophils Absolute 0.0 0 - 0.7 K/uL   Basophils Relative 0 %   Basophils Absolute 0.0 0 - 0.1 K/uL  Troponin I     Status: Abnormal   Collection Time: 08/18/15  1:30 PM  Result Value Ref Range   Troponin I 0.09 (HH) <0.03 ng/mL    Comment: CRITICAL RESULT CALLED TO, READ BACK BY AND VERIFIED WITH  ALICIA GRANGER AT 6387 08/18/15 SDR   Urinalysis complete, with microscopic     Status: Abnormal   Collection Time: 08/18/15  1:48 PM  Result Value Ref Range   Color, Urine YELLOW (A) YELLOW   APPearance TURBID (A) CLEAR   Glucose, UA NEGATIVE NEGATIVE mg/dL   Bilirubin Urine NEGATIVE NEGATIVE   Ketones, ur NEGATIVE NEGATIVE mg/dL   Specific Gravity, Urine 1.024 1.005 - 1.030   Hgb urine dipstick 2+ (A) NEGATIVE   pH 5.0 5.0 - 8.0   Protein, ur 100 (A) NEGATIVE mg/dL   Nitrite NEGATIVE NEGATIVE   Leukocytes, UA 2+ (A) NEGATIVE   RBC / HPF TOO NUMEROUS TO COUNT 0 - 5 RBC/hpf   WBC, UA TOO NUMEROUS TO COUNT 0 - 5 WBC/hpf   Bacteria, UA FEW (A) NONE SEEN   Squamous  Epithelial / LPF 0-5 (A) NONE SEEN   Trans Epithel, UA 6    WBC Clumps PRESENT    Dg Chest 1 View  08/18/2015  CLINICAL DATA:  Altered mental status with decreased oxygen saturation EXAM: CHEST 1 VIEW COMPARISON:  Jun 26, 2013 FINDINGS: There is airspace opacity in the right mid lung, most likely representing pneumonia. Lungs elsewhere clear. Heart is upper normal in size with pulmonary vascularity within normal limits. There is atherosclerotic calcification in the aorta. No adenopathy evident. Bones are osteoporotic. IMPRESSION: Airspace opacity right mid lung, most likely pneumonia. Lungs elsewhere clear. There is aortic atherosclerosis. Bones osteoporotic. Followup PA and lateral chest radiographs recommended in 3-4 weeks following trial of antibiotic therapy to ensure resolution and exclude underlying malignancy. Electronically Signed   By: Lowella Grip III M.D.   On: 08/18/2015 14:08    Review of Systems  Unable to perform ROS: critical illness    Blood pressure 144/99, pulse 110, temperature 100.5 F (38.1 C), temperature source Core (Comment), resp. rate 25, height 5' 2"  (1.575 m), weight 50.984 kg (112 lb 6.4 oz), SpO2 98 %. Physical Exam  Constitutional:  Thin, critically ill looking female in moderate distress.  HENT:  Head: Normocephalic.  Oral mucosa dry.  Eyes: Pupils are equal, round, and reactive to light. No scleral icterus.  Neck: Neck supple. No JVD present. No tracheal deviation present. No thyromegaly present.  Cardiovascular:  No murmur heard. Tachy with no murmur  Respiratory:  Scattered rhonci No dullness to percusion Moderate use of accessary muscles.  GI: Soft. Bowel sounds are normal. She exhibits no distension and no mass.  Musculoskeletal: She exhibits no edema.  Lymphadenopathy:    She has no cervical adenopathy.  Neurological:  Does not respond to verbal commands. Withdrawls to stimuli.   Skin: Skin is warm and dry.     Assessment/Plan 1.  Sepsis: Secondary to pneumonia. Receiving fluid resuscitation. Continue aggressive fluids and treat underlying cause.  2. Pneumonia: CXR show RML infiltrate. Will start Zosyn and Vanc. Cultures drawn.  3. Hypernatremia: Likely dehydration from fever and sepsis.  On NS IVF. Recheck sodium later today. Change to half NS if needed.  4. Acute Renal Failure: Secondary to sepsis. IVF. Monitor.  5. Elevated Trop: Likely demand ischemia. Repeat trop. No cardiac work up at this time.  6. Pyuria: Possible UTI. Above abx should cover.  7. Code Status: Spoke with son who is POA, Inda Coke, and he confirms DNR status.  Total critical care time spent = 60 min  Baxter Hire, MD 08/18/2015, 3:15 PM

## 2015-08-18 NOTE — ED Notes (Signed)
[facility] c/o altered mental status. EMS called out for breathing difficulty and low O2 sats. EMS reports pt temp 101.7 axillary, P116-124, increased RR, O2 94-95% on 2L n/c. SNF staff reports pt is not responding as usual, does not open eyes when stimulated. Withdraws from pain but does not follow commands. Hx dementia. Pt had temp >102 yesterday, had bacteria in her urine which was sent for a culture.

## 2015-08-18 NOTE — Progress Notes (Signed)
Pharmacy Antibiotic Note  Cathy Edwards is a 82 y.o. female admitted on 08/18/2015 with sepsis.  Pharmacy has been consulted for vancomycin & piperacillin/tazobactam dosing.  Plan: Piperacillin/tazobactam 3.375 g IV q8h EI  Patient received vancomycin 1000 mg dose in the ED Will follow with vancomycin 500 mg IV q24h beginning tomorrow (no stacked dose) Goal vancomycin trough 15-20 mcg/mL Vancomycin trough ordered for 7/7 @ 1230  Height: 5\' 2"  (157.5 cm) Weight: 112 lb 6.4 oz (50.984 kg) IBW/kg (Calculated) : 50.1  Temp (24hrs), Avg:101 F (38.3 C), Min:100.5 F (38.1 C), Max:101.4 F (38.6 C)   Recent Labs Lab 08/17/15 1730 08/18/15 1330  WBC 20.4* 19.2*  CREATININE 1.33* 1.31*  LATICACIDVEN  --  1.2    Estimated Creatinine Clearance: 23 mL/min (by C-G formula based on Cr of 1.31).    Allergies  Allergen Reactions  . Demerol [Meperidine] Other (See Comments)    unknown  . Epinephrine Other (See Comments)    unknown    Antimicrobials this admission: vancomycin 7/4 >>  Piperacillin/tazobactam 7/4 >>   Dose adjustments this admission:  Microbiology results: 7/4 BCx: Sent 7/4 UCx: Sent   Thank you for allowing pharmacy to be a part of this patient's care.  Cindi CarbonMary M Doyl Bitting, PharmD Clinical Pharmacist 08/18/2015 3:12 PM

## 2015-08-18 NOTE — Progress Notes (Signed)
[facility]:  [facility]:  [phone]) Provider:  Toni Arthurs, NP-C  Kirk Ruths., MD  Patient Care Team: Kirk Ruths, MD as PCP - General (Internal Medicine)  Extended Emergency Contact Information Primary Emergency Contact: Mcclintock,Annie Address: [address]          [place], [place] [address] Johnnette Litter of [place] Phone: [phone] Mobile Phone: [phone] Relation: Relative Secondary Emergency Contact: Kivi,William A Address: [address]          [place], [place] [address] Johnnette Litter of [place] Phone: [phone] Mobile Phone: [phone] Relation: Son  Code Status:  DNR Goals of care: Advanced Directive information Advanced Directives 08/18/2015  Does patient have an advance directive? Yes  Type of Advance Directive Out of facility DNR (pink MOST or yellow form)  Does patient want to make changes to advanced directive? No - Patient declined  Copy of advanced directive(s) in chart? Yes     Chief Complaint  Patient presents with  . Altered Mental Status    HPI:  Pt is a 82 y.o. female seen today for an acute visit for Altered Mental Status, fever. CNA reports she has not been eating well for about 3 days. At baseline, res has Alzheimer's Disease and has progressed to the point of being essentially non-verbal, needs assistance feeding and is a Total Care Pt. This makes it difficult to obtain a full ROS. She does typically respond to name/ verbal stimuli. Nursing reports a temp earlier in the day of 102 that decreased to 99.0 after tylenol. Nursing reports symptoms started just earlier in the day. Was at baseline yesterday.      Past Medical History  Diagnosis Date  . Carotid stenosis   . Hypertension   . TIA (transient ischemic attack)   . Hypothyroidism   . Dementia   . Hx: UTI (urinary tract infection)   . Arthritis     RA   Past Surgical History  Procedure Laterality Date  . Total knee  arthroplasty Right   . Appendectomy    . Cholecystectomy      Allergies  Allergen Reactions  . Demerol [Meperidine] Other (See Comments)    unknown  . Epinephrine Other (See Comments)    unknown      Medication List    Notice    This visit is during an admission. Changes to the med list made in this visit will be reflected in the After Visit Summary of the admission.      Review of Systems  Unable to perform ROS: Patient nonverbal  Constitutional: Positive for fever, diaphoresis and appetite change.  HENT: Negative.   Respiratory: Negative for cough, choking, shortness of breath and wheezing.   Cardiovascular: Negative.   Gastrointestinal: Negative.   Neurological: Negative for tremors, seizures, syncope and facial asymmetry.  All other systems reviewed and are negative.   Immunization History  Administered Date(s) Administered  . Influenza,inj,Quad PF,36+ Mos 11/19/2013  . Pneumococcal-Unspecified 11/19/1998   There are no preventive care reminders to display for this patient. No flowsheet data found. Functional Status Survey:    There were no vitals filed for this visit. There is no height or weight on file to calculate BMI. Physical Exam  Constitutional: She appears well-developed and well-nourished. She appears lethargic. She appears ill.  HENT:  Mouth/Throat: Mucous membranes are normal.  Eyes: Conjunctivae and lids are normal. Pupils are equal, round, and reactive to light.  Unable to focus. Pt  continued to stare upwards to the ceiling.   Neck: Trachea normal. No JVD present.  Cardiovascular: Normal pulses.  A regularly irregular rhythm present. Tachycardia present.  Exam reveals distant heart sounds. Exam reveals no gallop and no friction rub.   No murmur heard. Pulmonary/Chest: Effort normal. No accessory muscle usage. No respiratory distress. She has decreased breath sounds in the right lower field and the left lower field. She has no wheezes. She has no  rhonchi. She has no rales.  Abdominal: Normal appearance and bowel sounds are normal. There is generalized tenderness.  Neurological: She appears lethargic.  Difficult to rouse  Skin: Skin is warm and intact. She is diaphoretic. No cyanosis. No pallor. Nails show no clubbing.    Labs reviewed:  Recent Labs  05/19/15 1000 08/17/15 1730 08/18/15 1330 08/18/15 1811  NA 141 147* 150* 150*  K 4.2 3.4* 3.3*  --   CL 110 115* 119*  --   CO2 26 21* 22  --   GLUCOSE 110* 119* 130*  --   BUN 28* 34* 42*  --   CREATININE 1.23* 1.33* 1.31*  --   CALCIUM 9.6 9.9 10.0  --   MG 2.1  --   --   --     Recent Labs  05/19/15 1000 08/17/15 1730 08/18/15 1330  AST 21 17 17   ALT 16 11* 13*  ALKPHOS 81 80 78  BILITOT 0.7 0.5 0.6  PROT 7.1 7.2 7.1  ALBUMIN 3.6 3.1* 2.7*    Recent Labs  05/19/15 1000 08/17/15 1730 08/18/15 1330  WBC 8.8 20.4* 19.2*  NEUTROABS 5.8 19.2* 18.1*  HGB 14.0 13.2 13.5  HCT 42.3 39.7 39.7  MCV 88.6 90.3 89.5  PLT 237 255 271   Lab Results  Component Value Date   TSH 3.589 05/19/2015   No results found for: HGBA1C Lab Results  Component Value Date   CHOL 174 05/19/2015   HDL 43 05/19/2015   LDLCALC 92 05/19/2015   TRIG 193* 05/19/2015   CHOLHDL 4.0 05/19/2015    Significant Diagnostic Results in last 30 days:  Dg Chest 1 View  08/18/2015  CLINICAL DATA:  Altered mental status with decreased oxygen saturation EXAM: CHEST 1 VIEW COMPARISON:  Jun 26, 2013 FINDINGS: There is airspace opacity in the right mid lung, most likely representing pneumonia. Lungs elsewhere clear. Heart is upper normal in size with pulmonary vascularity within normal limits. There is atherosclerotic calcification in the aorta. No adenopathy evident. Bones are osteoporotic. IMPRESSION: Airspace opacity right mid lung, most likely pneumonia. Lungs elsewhere clear. There is aortic atherosclerosis. Bones osteoporotic. Followup PA and lateral chest radiographs recommended in 3-4 weeks  following trial of antibiotic therapy to ensure resolution and exclude underlying malignancy. Electronically Signed   By: Lowella Grip III M.D.   On: 08/18/2015 14:08    Assessment/Plan 1. Altered mental status, unspecified altered mental status type  Labs  Radiological exams  Hospice referral- family agreeable  Continue to treat sx: fever, etc until labs, etc results back  O2 2l Stamps for support  Family/ staff Communication:  Total Time:  65 minutes  Documentation: 45 minutes  Face to Face: 20 minutes  Family/Phone: Nursing communicated with family findings and options.   Labs/tests ordered:  CBC, Met C, 2-V CXR, KUB, UA, C&S  Vikki Ports, NP-C [facility] [address], [place] [address] Cell Phone (Mon-Fri 8am-5pm):  [phone] On Call:  [phone] & follow  prompts after 5pm & weekends Office Phone:  [phone] Office Fax:  [phone]

## 2015-08-18 NOTE — Progress Notes (Signed)
Called Dr. Letitia LibraJohnston to relay Troponin of 0.32.  Also told him she's been very Tachy between 120-140 since she got up to the floor w/an irregular HR.  He looked at Hx again and nothing about A-Fib.  We're going to continue w/agressive fluids of 150 ml/hr since she is dehydrated.  Not much output since getting to the floor.  No new orders for HR.

## 2015-08-19 DIAGNOSIS — R000 Tachycardia, unspecified: Secondary | ICD-10-CM | POA: Diagnosis not present

## 2015-08-19 LAB — BASIC METABOLIC PANEL
ANION GAP: 6 (ref 5–15)
Anion gap: 7 (ref 5–15)
BUN: 32 mg/dL — ABNORMAL HIGH (ref 6–20)
BUN: 31 mg/dL — ABNORMAL HIGH (ref 6–20)
CALCIUM: 8.9 mg/dL (ref 8.9–10.3)
CALCIUM: 8.8 mg/dL — AB (ref 8.9–10.3)
CHLORIDE: 125 mmol/L — AB (ref 101–111)
CHLORIDE: 128 mmol/L — AB (ref 101–111)
CO2: 20 mmol/L — AB (ref 22–32)
CO2: 19 mmol/L — AB (ref 22–32)
CREATININE: 1.08 mg/dL — AB (ref 0.44–1.00)
Creatinine, Ser: 0.99 mg/dL (ref 0.44–1.00)
GFR calc Af Amer: 51 mL/min — ABNORMAL LOW (ref >60)
GFR calc non Af Amer: 44 mL/min — ABNORMAL LOW (ref >60)
GFR calc non Af Amer: 49 mL/min — ABNORMAL LOW (ref >60)
GFR, EST AFRICAN AMERICAN: 57 mL/min — AB (ref >60)
GLUCOSE: 117 mg/dL — AB (ref 65–99)
GLUCOSE: 135 mg/dL — AB (ref 65–99)
Potassium: 2.8 mmol/L — ABNORMAL LOW (ref 3.5–5.1)
Potassium: 3.7 mmol/L (ref 3.5–5.1)
Sodium: 152 mmol/L — ABNORMAL HIGH (ref 135–145)
Sodium: 153 mmol/L — ABNORMAL HIGH (ref 135–145)

## 2015-08-19 LAB — CBC
HCT: 35.2 % (ref 35.0–47.0)
HEMOGLOBIN: 12.1 g/dL (ref 12.0–16.0)
MCH: 31 pg (ref 26.0–34.0)
MCHC: 34.4 g/dL (ref 32.0–36.0)
MCV: 90 fL (ref 80.0–100.0)
PLATELETS: 229 10*3/uL (ref 150–440)
RBC: 3.91 MIL/uL (ref 3.80–5.20)
RDW: 15.7 % — ABNORMAL HIGH (ref 11.5–14.5)
WBC: 15.9 10*3/uL — ABNORMAL HIGH (ref 3.6–11.0)

## 2015-08-19 LAB — URINE CULTURE

## 2015-08-19 LAB — MRSA PCR SCREENING: MRSA BY PCR: NEGATIVE

## 2015-08-19 LAB — MAGNESIUM: Magnesium: 1.8 mg/dL (ref 1.7–2.4)

## 2015-08-19 LAB — TROPONIN I: Troponin I: 0.1 ng/mL (ref <0.03)

## 2015-08-19 MED ORDER — ENOXAPARIN SODIUM 40 MG/0.4ML ~~LOC~~ SOLN: 40.0000 mg | SUBCUTANEOUS | Status: DC

## 2015-08-19 MED ORDER — ENOXAPARIN SODIUM 30 MG/0.3ML ~~LOC~~ SOLN
30.0000 mg | SUBCUTANEOUS | Status: DC
  Administered 2015-08-19: 13:00:00 30 mg via SUBCUTANEOUS
  Filled 2015-08-19: qty 0.3

## 2015-08-19 MED ORDER — METOPROLOL TARTRATE 5 MG/5ML IV SOLN: 2.5000 mg | INTRAVENOUS | Status: DC

## 2015-08-19 MED ORDER — METOPROLOL TARTRATE 5 MG/5ML IV SOLN: 2.5000 mg | Freq: Four times a day (QID) | INTRAVENOUS | Status: DC | PRN

## 2015-08-19 MED ORDER — METOPROLOL TARTRATE 5 MG/5ML IV SOLN
5.0000 mg | INTRAVENOUS | Status: AC
  Administered 2015-08-19: 5 mg via INTRAVENOUS
  Filled 2015-08-19: qty 5

## 2015-08-19 MED ORDER — POTASSIUM CHLORIDE 20 MEQ PO PACK
40.0000 meq | PACK | Freq: Once | ORAL | Status: AC
  Administered 2015-08-19: 40 meq via ORAL
  Filled 2015-08-19: qty 2

## 2015-08-19 MED ORDER — POTASSIUM CHLORIDE 10 MEQ/100ML IV SOLN
10.0000 meq | INTRAVENOUS | Status: AC
  Administered 2015-08-19 (×4): 10 meq via INTRAVENOUS
  Filled 2015-08-19 (×4): qty 100

## 2015-08-19 MED ORDER — KCL IN DEXTROSE-NACL 30-5-0.45 MEQ/L-%-% IV SOLN
INTRAVENOUS | Status: DC
  Filled 2015-08-19: qty 1000

## 2015-08-19 MED ORDER — DILTIAZEM HCL 25 MG/5ML IV SOLN
10.0000 mg | Freq: Once | INTRAVENOUS | Status: DC
  Filled 2015-08-19: qty 5

## 2015-08-19 MED ORDER — KCL IN DEXTROSE-NACL 20-5-0.45 MEQ/L-%-% IV SOLN
INTRAVENOUS | Status: DC
  Administered 2015-08-19 (×2): via INTRAVENOUS
  Filled 2015-08-19 (×3): qty 1000

## 2015-08-19 NOTE — Progress Notes (Signed)
[facility] [place] at [facility]   PATIENT NAME: Cathy HammersRosemary Edwards    MR#:  [id number]  DATE OF BIRTH:  11/19/1923  SUBJECTIVE:  CHIEF COMPLAINT:   Chief Complaint  Patient presents with  . Altered Mental Status   - Lying in bed, grimacing to touch and withdrawing to pain - not verbal. HR was elevated this AM- afib with rvr - admitted with sepsis  REVIEW OF SYSTEMS:  Review of Systems  Unable to perform ROS: dementia    DRUG ALLERGIES:   Allergies  Allergen Reactions  . Demerol [Meperidine] Other (See Comments)    unknown  . Epinephrine Other (See Comments)    unknown    VITALS:  Blood pressure 145/63, pulse 81, temperature 99.8 F (37.7 C), temperature source Oral, resp. rate 24, height 5\' 2"  (1.575 m), weight 50.984 kg (112 lb 6.4 oz), SpO2 97 %.  PHYSICAL EXAMINATION:  Physical Exam  GENERAL:  82 y.o.-year-old ill appearing, patient lying in the bed.  EYES: Pupils equal, round, reactive to light and accommodation. No scleral icterus.  HEENT: Head atraumatic, normocephalic. Oropharynx and nasopharynx clear.  NECK:  Supple, no jugular venous distention. No thyroid enlargement, no tenderness.  LUNGS: Normal breath sounds bilaterally, no wheezing, rales,rhonchi or crepitation. No use of accessory muscles of respiration. Decreased bibasilar breath sounds CARDIOVASCULAR: S1, S2 rapid and irregularl. No rubs, or gallops. 2/6 systolic murmur present. ABDOMEN: Soft, nontender, nondistended. Bowel sounds present. No organomegaly or mass.  EXTREMITIES: No pedal edema, cyanosis, or clubbing.  NEUROLOGIC: unable to do a complete neuro exam, not following commands, not tracking. Eyes open and grimacing occasionally, withdrawing extremities to pain, non verbal PSYCHIATRIC: The patient is alert and not responding.  SKIN: No obvious rash, lesion, or ulcer.    LABORATORY PANEL:   CBC  Recent Labs Lab 08/19/15 0428  WBC 15.9*  HGB 12.1  HCT 35.2   PLT 229   ------------------------------------------------------------------------------------------------------------------  Chemistries   Recent Labs Lab 08/18/15 1330  08/19/15 0428  NA 150*  < > 152*  K 3.3*  --  2.8*  CL 119*  --  125*  CO2 22  --  20*  GLUCOSE 130*  --  117*  BUN 42*  --  32*  CREATININE 1.31*  --  1.08*  CALCIUM 10.0  --  8.9  MG  --   --  1.8  AST 17  --   --   ALT 13*  --   --   ALKPHOS 78  --   --   BILITOT 0.6  --   --   < > = values in this interval not displayed. ------------------------------------------------------------------------------------------------------------------  Cardiac Enzymes  Recent Labs Lab 08/19/15 0426  TROPONINI 0.10*   ------------------------------------------------------------------------------------------------------------------  RADIOLOGY:  Dg Chest 1 View  08/18/2015  CLINICAL DATA:  Altered mental status with decreased oxygen saturation EXAM: CHEST 1 VIEW COMPARISON:  Jun 26, 2013 FINDINGS: There is airspace opacity in the right mid lung, most likely representing pneumonia. Lungs elsewhere clear. Heart is upper normal in size with pulmonary vascularity within normal limits. There is atherosclerotic calcification in the aorta. No adenopathy evident. Bones are osteoporotic. IMPRESSION: Airspace opacity right mid lung, most likely pneumonia. Lungs elsewhere clear. There is aortic atherosclerosis. Bones osteoporotic. Followup PA and lateral chest radiographs recommended in 3-4 weeks following trial of antibiotic therapy to ensure resolution and exclude underlying malignancy. Electronically Signed   By: Bretta BangWilliam  Woodruff III M.D.   On: 08/18/2015 14:08  EKG:   Orders placed or performed during the hospital encounter of 08/18/15  . ED EKG 12-Lead  . ED EKG 12-Lead  . EKG 12-Lead  . EKG 12-Lead    ASSESSMENT AND PLAN:   82 year old female with past medical history significant for dementia, hypertension, history  of subdural hematoma, arthritis who [facility] [place] [place] [facility] to the hospital secondary to sepsis.  #1 sepsis-secondary to pneumonia and also UTI. -Blood cultures and urine cultures are pending at this time. -Continue vancomycin and Zosyn. Continue IV fluid resuscitation.  #2 altered mental status-secondary to metabolic encephalopathy on top of underlying dementia. Nonverbal at baseline. CT of the head is negative for any acute abnormalities. Continue to monitor.  #3 atrial fibrillation with rapid ventricular response-secondary to underlying sepsis and electrolyte abnormalities. -Give a dose of metoprolol IV push and follow heart rate. When necessary pressures if needed. Continue telemetry monitoring.  #4 hypokalemia-being replaced aggressively. Check magnesium level as well.  #5 hypernatremia-secondary to free water deficit. On D5 half normal saline. Continue to monitor sodium. If further increasing, change fluids to D5 water.  #6 dementia- nonverbal at baseline, seems close to baseline. Continue to monitor. On Topamax, Celexa and trazodone. Will continue that.  #7 GERD-on Protonix  #8 DVT prophylaxis-on heparin. Change to Lovenox daily  Appears to have extremely poor quality of life. Has had multiple readmissions for sepsis recently. -Patient is a DO NOT RESUSCITATE. Would benefit from hospice services if appropriate. -Palliative care consulted.   All the records are reviewed and case discussed with Care Management/Social Workerr. Management plans discussed with the patient, family and they are in agreement.  CODE STATUS: DNR   TOTAL TIME TAKING CARE OF THIS PATIENT: 42 minutes.   POSSIBLE D/C IN ? DAYS, DEPENDING ON CLINICAL CONDITION.   Enid BaasKALISETTI,Janziel Hockett M.D on 08/19/2015 at 9:22 AM  Between 7am to 6pm - Pager - [phone]  After 6pm go to [email or web address] - password [email or web address] [facility]  [facility]  Office   [phone]  CC: Primary care physician; Lauro RegulusANDERSON,MARSHALL W., MD

## 2015-08-19 NOTE — Progress Notes (Signed)
Anticoagulation monitoring  82 yo F ordered enoxaparin 40mg  SQ every 24 hours. Per protocol for patients with CrCl <30 mL/min will transition patient to enoxaparin 30mg  SQ every 24 hours.   Pharmacy will continue monitoring dose per protocol.   Horris LatinoHolly Gilliam, PharmD

## 2015-08-19 NOTE — Clinical Social Work Note (Signed)
CSW was consulted as [facility] Pt is able to return as CSW followed up with facility. [facility] is from the [facility]. Hospice will follow at facility. CSW attempted to reach pt's son, however CSW left a message requested a return phone call. Assessment to follow. CSW will continue to follow.   Dede QuerySarah Kail Fraley, MSW, LCSW  Clinical Social Worker  [phone]

## 2015-08-19 NOTE — Progress Notes (Signed)
Notified Dr Sheryle Hailiamond of this am labs results, K+ 2.8 and sodium 152

## 2015-08-19 NOTE — Progress Notes (Signed)
Writer was able to speak with patient's on [name] via phone [phone]) regarding his mother. At this time he would like to continue with IV antibiotics for one more day and then decide. He will most likely want his [facility] [place] and focus on her comfort, but he feels like he needs to give her "one more day" before he decides. Attending physician Dr. Nemiah CommanderKalisetti made aware, as was CSW Cathy QuerySarah Edwards. Writer to Energy Transfer Partnerscontact Cathy Edwards again tomorrow as he will still be out of town and not available to come to the hospital. Dayna BarkerKaren Robertson RN, BSN, [facility] [place], [facility] L[facility]ison [phone] c

## 2015-08-19 NOTE — Progress Notes (Signed)
Patient was to be [facility]ospice SW [facility] writer [facility]. Writer has spoken to attending physician Dr. Nemiah CommanderKalisetti and CSW Maralyn SagoSarah to advise. Writer has left a message with patient's son Arnell SievingBilly Rosiak and spoken to her daughter in law Thayer Jewnnie Kinnear. Will await family decision regarding continuing aggressive treatments versus [facility]. Thank you. Dayna BarkerKaren Robertson RN, BSN, [facility] [place], hospital Liaison  [phone] c

## 2015-08-19 NOTE — Evaluation (Cosign Needed)
Clinical/Bedside Swallow Evaluation Patient Details  Name: Sherral HammersRosemary Bagdasarian MRN: [id number] Date of Birth: 04/22/1923  Today's Date: 08/19/2015 Time: SLP Start Time (ACUTE ONLY): 1100 SLP Stop Time (ACUTE ONLY): 1200 SLP Time Calculation (min) (ACUTE ONLY): 60 min  Past Medical History:  Past Medical History  Diagnosis Date  . Carotid stenosis   . Hypertension   . TIA (transient ischemic attack)   . Hypothyroidism   . Dementia   . Hx: UTI (urinary tract infection)   . Arthritis     RA   Past Surgical History:  Past Surgical History  Procedure Laterality Date  . Total knee arthroplasty Right   . Appendectomy    . Cholecystectomy     HPI:  Sherral HammersRosemary Kooi is a 82 y.o. female with a past medical history of hypertension, TIA, hypothyroidism, dementia, arthritis who presents to the emergency department from her nursing facility for altered mental status. According to EMS reported they were called out for increased confusion and difficulty breathing. Upon arrival they found the patient to be hypoxic, febrile and altered. Per nursing home report the patient had a fever beginning yesterday, had a urinalysis showing bacteria. They state the patient has baseline dementia with confusion, but is usually more responsive then today. Patient is not able to give history, does not follow commands.Pt currently NPO except sips of water for medicine. Skilled ST required to assess safety of PO intake and ability to advance diet.    Assessment / Plan / Recommendation Clinical Impression  Pt with severe oropharyngeal dysphagia likely d/t advance cognitive decline. Upon entering the room, pt noted with puree like substance spilling out of mouth. Pt unaware. ST attempted oral care but pt nonresponsive to max A cues to open mouth. Pt unaware of ice chip presentation at her mouth and continued to purse lips together when presented. Pt opened mouth x 1 with ice chip trials and pt was unaware of bolus in mouth. No oral or  pharyngeal response to PO stimulation. Pt's risk for aspiration or choking is high complicated by her advance cognitive deficits. ST recommends continued NPO status with sips of water with medicine if tolerated. Education provided to nursing and Sports coachcase manager.     Aspiration Risk  Severe aspiration risk;Risk for inadequate nutrition/hydration    Diet Recommendation NPO except for sips of water with medicine as tolerated  Medication Administration: Crushed with puree (As tolerated with sips of water)    Other  Recommendations Oral Care Recommendations: Oral care QID Other Recommendations: Remove water pitcher;Prohibited food (jello, ice cream, thin soups)   Follow up Recommendations   (Hospice Services)     Swallow Study   General Date of Onset: 08/18/15 HPI: Sherral HammersRosemary Smolinski is a 82 y.o. female with a past medical history of hypertension, TIA, hypothyroidism, dementia, arthritis who presents to the emergency department from her nursing facility for altered mental status. According to EMS reported they were called out for increased confusion and difficulty breathing. Upon arrival they found the patient to be hypoxic, febrile and altered. Per nursing home report the patient had a fever beginning yesterday, had a urinalysis showing bacteria. They state the patient has baseline dementia with confusion, but is usually more responsive then today. Patient is not able to give history, does not follow commands.Pt currently NPO except sips of water for medicine. Skilled ST required to assess safety of PO intake and ability to advance diet.  Type of Study: Bedside Swallow Evaluation Previous Swallow Assessment: N/A Diet Prior to this  Study: NPO Temperature Spikes Noted: Yes Respiratory Status: Room air History of Recent Intubation: No Behavior/Cognition: Confused;Lethargic/Drowsy;Doesn't follow directions Oral Cavity Assessment:  (Unable to assess, pt continued to close mouth) Oral Care Completed by SLP:  Other (Comment) (Attempted but pt continued to close mouth) Oral Cavity - Dentition: Edentulous Vision:  (N/A) Self-Feeding Abilities: Total assist Patient Positioning: Upright in bed (Unable to maintain safe posture for PO consumption) Baseline Vocal Quality: Not observed Volitional Cough: Cognitively unable to elicit Volitional Swallow: Unable to elicit    Oral/Motor/Sensory Function Overall Oral Motor/Sensory Function: Other (comment) (Unable to assess, pt nonresponse to stimulation)   Ice Chips Ice chips: Impaired (Attempted, pt unaware of bolus, no swallow initiation) Presentation: Spoon (SLP fed) Oral Phase Impairments: Reduced labial seal;Reduced lingual movement/coordination;Poor awareness of bolus (Pt unaware of ice chip in mouth) Oral Phase Functional Implications: Left anterior spillage;Prolonged oral transit Pharyngeal Phase Impairments: Unable to trigger swallow   Thin Liquid Thin Liquid: Not tested    Nectar Thick Nectar Thick Liquid: Not tested   Honey Thick Honey Thick Liquid: Not tested   Puree Puree: Not tested   Solid   GO   Solid: Not tested        Linzee Depaul 08/19/2015,12:17 PM

## 2015-08-20 LAB — BASIC METABOLIC PANEL
Anion gap: 5 (ref 5–15)
BUN: 29 mg/dL — AB (ref 6–20)
CHLORIDE: 127 mmol/L — AB (ref 101–111)
CO2: 20 mmol/L — ABNORMAL LOW (ref 22–32)
CREATININE: 0.95 mg/dL (ref 0.44–1.00)
Calcium: 8.8 mg/dL — ABNORMAL LOW (ref 8.9–10.3)
GFR calc Af Amer: 60 mL/min — ABNORMAL LOW (ref >60)
GFR, EST NON AFRICAN AMERICAN: 52 mL/min — AB (ref >60)
Glucose, Bld: 134 mg/dL — ABNORMAL HIGH (ref 65–99)
Potassium: 3.8 mmol/L (ref 3.5–5.1)
SODIUM: 152 mmol/L — AB (ref 135–145)

## 2015-08-20 MED ORDER — MORPHINE SULFATE (CONCENTRATE) 10 MG /0.5 ML PO SOLN: 10.0000 mg | ORAL | Status: AC | PRN

## 2015-08-20 MED ORDER — LORAZEPAM 2 MG/ML PO CONC: 1.0000 mg | Freq: Four times a day (QID) | ORAL | Status: AC | PRN

## 2015-08-20 MED ORDER — PROMETHAZINE HCL 12.5 MG RE SUPP
12.5000 mg | Freq: Four times a day (QID) | RECTAL | Status: AC | PRN
Start: 2015-08-20 — End: ?

## 2015-08-20 MED ORDER — DEXTROSE 5 % IV SOLN
INTRAVENOUS | Status: DC
  Administered 2015-08-20: 09:00:00 via INTRAVENOUS

## 2015-08-20 MED ORDER — ENOXAPARIN SODIUM 40 MG/0.4ML ~~LOC~~ SOLN
40.0000 mg | SUBCUTANEOUS | Status: DC
  Administered 2015-08-20: 40 mg via SUBCUTANEOUS
  Filled 2015-08-20: qty 0.4

## 2015-08-20 NOTE — Progress Notes (Signed)
Follow up visit made to new referral [facility]. Cathy Edwards seen lying in bed, eyes open, not tracking, no verbal response, she does move her right foot back and forth when spoken to. Warm to touch, temp 99.8 this morning per chart review.  Writer spoke again with patient's son Cathy Edwards via telephone this morning. Discussed her continued decline and no improvement in her labs despite IV fluids to correct her elevated sodium. She was able to take a bite of applesauce with a crushed medication per staff RN Cathy Edwards's report. Genevie CheshireBilly confirmed his choice to have his mother discharge [facility] with plans for her to be admitted to hospice services. He would like her medications to be focused on comfort and does not wish for her to return to the hospital. Plan will be for discharge today. Attending Dr. Nemiah Edwards and hospital care team all made aware. Signed DNR in place in discharge packet. Updated information faxed to hospice referral. Thank you. Cathy BarkerKaren Robertson RN, BSN, [facility] [place], hospital Liaison [phone] c

## 2015-08-20 NOTE — Discharge Planning (Addendum)
Patient IV and Foley removed.  [facility] and s/w Gretchen PortelaEvon Roach, LPN.  Packet and scripts sent in packet to facility.  RN assessment and VS revealed stability for DC to facility.  EMS notified for transport to room 252.

## 2015-08-20 NOTE — Care Management Important Message (Signed)
Important Message  Patient Details  Name: Cathy HammersRosemary Bilello MRN: [id number] Date of Birth: 01/05/1924   Medicare Important Message Given:  Yes    Olegario MessierKathy A Remas Sobel 08/20/2015, 11:26 AM

## 2015-08-20 NOTE — Clinical Social Work Note (Signed)
Clinical Social Work Assessment  Patient Details  Name: Cathy HammersRosemary Breithaupt MRN: [id number] Date of Birth: 12/18/1923  Date of referral:  08/20/15               Reason for consult:  Facility Placement                Permission sought to share information with:  Family Supports Permission granted to share information::  Yes, Verbal Permission Granted  Name::     Arnell SievingBilly [place]  Relationship::  son  Contact Information:  [phone]  Housing/Transportation Living arrangements for the past 2 months:  Skilled Nursing Facility Source of Information:  Adult Children Patient Interpreter Needed:  None Criminal Activity/Legal Involvement Pertinent to Current Situation/Hospitalization:  No - Comment as needed Significant Relationships:  Adult Children Lives with:   Facility resident Do you feel safe going back to the place where you live?  Yes Need for family participation in patient care:  Yes (Comment)  Care giving concerns:  No care giving needs identified.   Social Worker assessment / plan:  CSW spoke with pt's son to address consult. CSW introduced herself and explained role of social work. [facility]. Pt's son is aware and agreeable to discharge plan.  have [facility] following. CSW prepared discharge. Hospital Liaison is aware and following. CSW left a message with pt's son informing him off discharge. CSW is signing off as no further needs identified.   Employment status:  Retired Health and safety inspectornsurance information:  Medicare PT Recommendations:  Not assessed at this time Information / Referral to community resources:  Other (Comment Required) ([facility], [facility] [place])  Patient/Family's Response to care:  Pt's son was appreciative of CSW support.   Patient/Family's Understanding of and Emotional Response to Diagnosis, Current Treatment, and Prognosis:  Pt's son is aware that pt is returning to facility with  hospice services.   Emotional Assessment Appearance:  Appears stated age Attitude/Demeanor/Rapport:  Unable to Assess Affect (typically observed):  Unable to Assess Orientation:  Oriented to Self, Fluctuating Orientation (Suspected and/or reported Sundowners) Alcohol / Substance use:  Never Used Psych involvement (Current and /or in the community):  No (Comment)  Discharge Needs  Concerns to be addressed:  No discharge needs identified Readmission within the last 30 days:  No Current discharge risk:  None Barriers to Discharge:  No Barriers Identified   Dede QuerySarah Chrisann Melaragno, LCSW 08/20/2015, 4:38 PM

## 2015-08-20 NOTE — Discharge Summary (Signed)
[facility] [place] at [facility]   PATIENT NAME: Cathy Edwards    MR#:  [id number]  DATE OF BIRTH:  [birth year]  DATE OF ADMISSION:  08/18/2015 ADMITTING PHYSICIAN: Gracelyn Nurse, MD  DATE OF DISCHARGE: 08/20/15  PRIMARY CARE PHYSICIAN: Lauro Regulus., MD    ADMISSION DIAGNOSIS:  UTI (lower urinary tract infection) [N39.0] Healthcare-associated pneumonia [J18.9] Sepsis, due to unspecified organism [facility]) [A41.9]  DISCHARGE DIAGNOSIS:  Active Problems:   Sepsis (HCC)   SECONDARY DIAGNOSIS:   Past Medical History  Diagnosis Date  . Carotid stenosis   . Hypertension   . TIA (transient ischemic attack)   . Hypothyroidism   . Dementia   . Hx: UTI (urinary tract infection)   . Arthritis     RA    HOSPITAL COURSE:   82 year old female with past medical history significant for dementia, hypertension, history of subdural hematoma, arthritis who is a total care dependent from [facility] presents to the hospital secondary to sepsis. Patient has not much improved in spite of being on IV antibiotics-vancomycin and Zosyn. -Failed swallow evaluation. Family decided not to do aggressive measures. Still her sodium and chloride remained very high. Worsening dementia. -Hospice was already consulted as outpatient. Hospice RN has spoken with patient's son Genevie Cheshire who has decided discharge back to [facility] memory care with comfort medications only and hospice following.  She has the following diagnoses:  #1 sepsis-secondary to pneumonia and also UTI.  #2 altered mental status-secondary to metabolic encephalopathy on top of underlying dementia. Nonverbal at baseline.   #3 atrial fibrillation with rapid ventricular response-secondary to underlying sepsis and electrolyte abnormalities.  #4 hypokalemia-  #5 hypernatremia-secondary to free water deficit.   #6 dementia- nonverbal at baseline  #7 malnutrition  Patient will be discharged on Roxanol,  Ativan and Phenergan suppository.   DISCHARGE CONDITIONS:   Critical  CONSULTS OBTAINED:   None  DRUG ALLERGIES:   Allergies  Allergen Reactions  . Demerol [Meperidine] Other (See Comments)    unknown  . Epinephrine Other (See Comments)    unknown    DISCHARGE MEDICATIONS:   Current Discharge Medication List    START taking these medications   Details  LORazepam (ATIVAN) 2 MG/ML concentrated solution Take 0.5 mLs (1 mg total) by mouth every 6 (six) hours as needed for anxiety. Qty: 30 mL, Refills: 0    Morphine Sulfate (MORPHINE CONCENTRATE) 10 mg / 0.5 ml concentrated solution Take 0.5 mLs (10 mg total) by mouth every 2 (two) hours as needed for moderate pain, severe pain or shortness of breath. Qty: 15 mL, Refills: 0    promethazine (PHENERGAN) 12.5 MG suppository Place 1 suppository (12.5 mg total) rectally every 6 (six) hours as needed for nausea or vomiting. Qty: 12 each, Refills: 0      CONTINUE these medications which have NOT CHANGED   Details  acetaminophen (TYLENOL) 325 MG tablet Take 650 mg by mouth every 4 (four) hours as needed for mild pain, moderate pain or fever.       STOP taking these medications     Calcium Carbonate 500 MG CHEW      Cholecalciferol (VITAMIN D3) 2000 units capsule      citalopram (CELEXA) 10 MG tablet      guaifenesin (ROBITUSSIN) 100 MG/5ML syrup      HYDROcodone-acetaminophen (NORCO/VICODIN) 5-325 MG tablet      hyoscyamine (LEVBID) 0.375 MG 12 hr tablet      ipratropium-albuterol (DUONEB) 0.5-2.5 (3) MG/3ML  SOLN      levothyroxine (SYNTHROID, LEVOTHROID) 100 MCG tablet      omeprazole (PRILOSEC) 20 MG capsule      pravastatin (PRAVACHOL) 80 MG tablet      QUEtiapine (SEROQUEL) 25 MG tablet      topiramate (TOPAMAX) 50 MG tablet      traZODone (DESYREL) 50 MG tablet      vitamin B-12 (CYANOCOBALAMIN) 250 MCG tablet          DISCHARGE INSTRUCTIONS:   1. Discharge to [facility] memory care with hospice  following and comfort meds only.   If you experience worsening of your admission symptoms, develop shortness of breath, life threatening emergency, suicidal or homicidal thoughts you must seek medical attention immediately by calling 911 or calling your MD immediately  if symptoms less severe.  You Must read complete instructions/literature along with all the possible adverse reactions/side effects for all the Medicines you take and that have been prescribed to you. Take any new Medicines after you have completely understood and accept all the possible adverse reactions/side effects.   Please note  You were cared for by a hospitalist during your hospital stay. If you have any questions about your discharge medications or the care you received while you were in the hospital after you are discharged, you can call the unit and asked to speak with the hospitalist on call if the hospitalist that took care of you is not available. Once you are discharged, your primary care physician will handle any further medical issues. Please note that NO REFILLS for any discharge medications will be authorized once you are discharged, as it is imperative that you return to your primary care physician (or establish a relationship with a primary care physician if you do not have one) for your aftercare needs so that they can reassess your need for medications and monitor your lab values.    Today   CHIEF COMPLAINT:   Chief Complaint  Patient presents with  . Altered Mental Status    VITAL SIGNS:  Blood pressure 140/82, pulse 94, temperature 99.8 F (37.7 C), temperature source Oral, resp. rate 20, height 5\' 6"  (1.676 m), weight 50.973 kg (112 lb 6 oz), SpO2 97 %.  I/O:   Intake/Output Summary (Last 24 hours) at 08/20/15 1206 Last data filed at 08/20/15 0500  Gross per 24 hour  Intake      0 ml  Output    600 ml  Net   -600 ml    PHYSICAL EXAMINATION:   Physical Exam  GENERAL: 82 y.o.-year-old ill  appearing, patient lying in the bed.  EYES: Pupils equal, round, reactive to light and accommodation. No scleral icterus.  HEENT: Head atraumatic, normocephalic. Oropharynx and nasopharynx clear.  NECK: Supple, no jugular venous distention. No thyroid enlargement, no tenderness.  LUNGS: Normal breath sounds bilaterally, no wheezing, rales,rhonchi or crepitation. No use of accessory muscles of respiration. Decreased bibasilar breath sounds CARDIOVASCULAR: S1, S2 rapid and irregularl. No rubs, or gallops. 2/6 systolic murmur present. ABDOMEN: Soft, nontender, nondistended. Bowel sounds present. No organomegaly or mass.  EXTREMITIES: No pedal edema, cyanosis, or clubbing.  NEUROLOGIC: unable to do a complete neuro exam, not following commands, not tracking. Eyes open and grimacing occasionally, withdrawing extremities to pain, non verbal PSYCHIATRIC: The patient is alert and not responding.  SKIN: No obvious rash, lesion, or ulcer.   DATA REVIEW:   CBC  Recent Labs Lab 08/19/15 0428  WBC 15.9*  HGB 12.1  HCT 35.2  PLT 229    Chemistries   Recent Labs Lab 08/18/15 1330  08/19/15 0428  08/20/15 0522  NA 150*  < > 152*  < > 152*  K 3.3*  --  2.8*  < > 3.8  CL 119*  --  125*  < > 127*  CO2 22  --  20*  < > 20*  GLUCOSE 130*  --  117*  < > 134*  BUN 42*  --  32*  < > 29*  CREATININE 1.31*  --  1.08*  < > 0.95  CALCIUM 10.0  --  8.9  < > 8.8*  MG  --   --  1.8  --   --   AST 17  --   --   --   --   ALT 13*  --   --   --   --   ALKPHOS 78  --   --   --   --   BILITOT 0.6  --   --   --   --   < > = values in this interval not displayed.  Cardiac Enzymes  Recent Labs Lab 08/19/15 0426  TROPONINI 0.10*    Microbiology Results  Results for orders placed or performed during the hospital encounter of 08/18/15  Culture, blood (Routine x 2)     Status: None (Preliminary result)   Collection Time: 08/18/15  1:30 PM  Result Value Ref Range Status   Specimen Description  BLOOD LEFT ARM  Final   Special Requests   Final    BOTTLES DRAWN AEROBIC AND ANAEROBIC  AERO 3CC ANA 2CC   Culture NO GROWTH 2 DAYS  Final   Report Status PENDING  Incomplete  Culture, blood (Routine x 2)     Status: None (Preliminary result)   Collection Time: 08/18/15  1:35 PM  Result Value Ref Range Status   Specimen Description BLOOD RIGHT ARM  Final   Special Requests BOTTLES DRAWN AEROBIC AND ANAEROBIC  AERO 5CC 3CC  Final   Culture NO GROWTH 2 DAYS  Final   Report Status PENDING  Incomplete  Urine culture     Status: Abnormal   Collection Time: 08/18/15  1:48 PM  Result Value Ref Range Status   Specimen Description URINE, RANDOM  Final   Special Requests NONE  Final   Culture MULTIPLE SPECIES PRESENT, SUGGEST RECOLLECTION (A)  Final   Report Status 08/19/2015 FINAL  Final  MRSA PCR Screening     Status: None   Collection Time: 08/19/15  3:45 AM  Result Value Ref Range Status   MRSA by PCR NEGATIVE NEGATIVE Final    Comment:        The GeneXpert MRSA Assay (FDA approved for NASAL specimens only), is one component of a comprehensive MRSA colonization surveillance program. It is not intended to diagnose MRSA infection nor to guide or monitor treatment for MRSA infections.     RADIOLOGY:  Dg Chest 1 View  08/18/2015  CLINICAL DATA:  Altered mental status with decreased oxygen saturation EXAM: CHEST 1 VIEW COMPARISON:  Jun 26, 2013 FINDINGS: There is airspace opacity in the right mid lung, most likely representing pneumonia. Lungs elsewhere clear. Heart is upper normal in size with pulmonary vascularity within normal limits. There is atherosclerotic calcification in the aorta. No adenopathy evident. Bones are osteoporotic. IMPRESSION: Airspace opacity right mid lung, most likely pneumonia. Lungs elsewhere clear. There is aortic atherosclerosis. Bones osteoporotic. Followup PA and lateral chest radiographs recommended  in 3-4 weeks following trial of antibiotic therapy to ensure  resolution and exclude underlying malignancy. Electronically Signed   By: Bretta BangWilliam  Woodruff III M.D.   On: 08/18/2015 14:08    EKG:   Orders placed or performed during the hospital encounter of 08/18/15  . ED EKG 12-Lead  . ED EKG 12-Lead  . EKG 12-Lead  . EKG 12-Lead      Management plans discussed with the patient, family and they are in agreement.  CODE STATUS:     Code Status Orders        Start     Ordered   08/18/15 1617  Do not attempt resuscitation (DNR)   Continuous    Question Answer Comment  In the event of cardiac or respiratory ARREST Do not call a "code blue"   In the event of cardiac or respiratory ARREST Do not perform Intubation, CPR, defibrillation or ACLS   In the event of cardiac or respiratory ARREST Use medication by any route, position, wound care, and other measures to relive pain and suffering. May use oxygen, suction and manual treatment of airway obstruction as needed for comfort.      08/18/15 1617    Code Status History    Date Active Date Inactive Code Status Order ID Comments User Context   11/18/2013  1:01 PM 11/19/2013  3:42 PM DNR [id number]  Alba CoryBelkys A Regalado, MD Inpatient    Advance Directive Documentation        Most Recent Value   Type of Advance Directive  Out of facility DNR (pink MOST or yellow form)   Pre-existing out of facility DNR order (yellow form or pink MOST form)     "MOST" Form in Place?        TOTAL TIME TAKING CARE OF THIS PATIENT: 37 minutes.    Parth Mccormac M.D on 08/20/2015 at 12:06 PM  Between 7am to 6pm - Pager - [phone]  After 6pm go to [email or web address] - password [email or web address]  [facility]  Office  [phone]  CC: Primary care physician; Lauro RegulusANDERSON,MARSHALL W., MD

## 2015-08-20 NOTE — Progress Notes (Signed)
Patient with orders for enoxaparin 30mg  SQ daily due to renal function <30 ml/min. Today, renal function improving.   Orders adjusted to enoxaparin 40mg  SQ daily.   Estimated Creatinine Clearance: 32.3 mL/min (by C-G formula based on Cr of 0.95).  Pharmacy will continue to monitor.  Roque CashAllison Rajanae Mantia, PharmD Clinical Pharmacist  08/20/2015

## 2015-08-20 NOTE — NC FL2 (Signed)
[place] MEDICAID FL2 LEVEL OF CARE SCREENING TOOL     IDENTIFICATION  Patient Name: Cathy Edwards Birthdate: 02/23/1923 Sex: female Admission Date (Current Location): 08/18/2015  [facility] [place]   Facility and Address:  [facility], [address], [place], [place] [address]      Provider Number: [id number]  Attending Physician Name and Address:  Enid Baasadhika Kalisetti, MD  Relative Name and Phone Number:       Current Level of Care: Hospital Recommended Level of Care: Memory Care Prior Approval Number:    Date Approved/Denied:   PASRR Number:    Discharge Plan: Domiciliary (Rest home) (memory care)    Current Diagnoses: Patient Active Problem List   Diagnosis Date Noted  . Sepsis (HCC) 08/18/2015  . Subdural hematoma (HCC) 11/18/2013  . 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 HTN (hypertension), benign 11/18/2013  . Hypothyroidism 11/18/2013  . SDH (subdural hematoma) (HCC) 11/18/2013    Orientation RESPIRATION BLADDER Height & Weight     Self  O2 (3L) Continent Weight: 112 lb 6 oz (50.973 kg) Height:  5\' 6"  (167.6 cm)  BEHAVIORAL SYMPTOMS/MOOD NEUROLOGICAL BOWEL NUTRITION STATUS      Continent Diet (Comfort Feeds)  AMBULATORY STATUS COMMUNICATION OF NEEDS Skin   Total Care Verbally Normal                       Personal Care Assistance Level of Assistance  Bathing, Feeding, Dressing, Total care Bathing Assistance: Maximum assistance Feeding assistance: Maximum assistance Dressing Assistance: Maximum assistance Total Care Assistance: Maximum assistance   Functional Limitations Info  Sight, Hearing, Speech Sight Info: Impaired Hearing Info: Impaired Speech Info: Impaired    SPECIAL CARE FACTORS FREQUENCY                       Contractures Contractures Info: Not present    Additional Factors Info  Code Status, Allergies Code Status Info: DNR  Allergies Info: Demerol,  Epinehrine           Discharge Medications: Please see discharge summary for a list of discharge medications.  Current Discharge Medication List    START taking these medications   Details  LORazepam (ATIVAN) 2 MG/ML concentrated solution Take 0.5 mLs (1 mg total) by mouth every 6 (six) hours as needed for anxiety. Qty: 30 mL, Refills: 0    Morphine Sulfate (MORPHINE CONCENTRATE) 10 mg / 0.5 ml concentrated solution Take 0.5 mLs (10 mg total) by mouth every 2 (two) hours as needed for moderate pain, severe pain or shortness of breath. Qty: 15 mL, Refills: 0    promethazine (PHENERGAN) 12.5 MG suppository Place 1 suppository (12.5 mg total) rectally every 6 (six) hours as needed for nausea or vomiting. Qty: 12 each, Refills: 0      CONTINUE these medications which have NOT CHANGED   Details  acetaminophen (TYLENOL) 325 MG tablet Take 650 mg by mouth every 4 (four) hours as needed for mild pain, moderate pain or fever.       STOP taking these medications     Calcium Carbonate 500 MG CHEW      Cholecalciferol (VITAMIN D3) 2000 units capsule      citalopram (CELEXA) 10 MG tablet      guaifenesin (ROBITUSSIN) 100 MG/5ML syrup      HYDROcodone-acetaminophen (NORCO/VICODIN) 5-325 MG tablet      hyoscyamine (LEVBID) 0.375 MG 12 hr tablet  ipratropium-albuterol (DUONEB) 0.5-2.5 (3) MG/3ML SOLN      levothyroxine (SYNTHROID, LEVOTHROID) 100 MCG tablet      omeprazole (PRILOSEC) 20 MG capsule      pravastatin (PRAVACHOL) 80 MG tablet      QUEtiapine (SEROQUEL) 25 MG tablet      topiramate (TOPAMAX) 50 MG tablet      traZODone (DESYREL) 50 MG tablet      vitamin B-12 (CYANOCOBALAMIN) 250 MCG tablet              Relevant Imaging Results:  Relevant Lab Results:   Additional Information SSN:  [id number]  Dede QuerySarah Jesson Foskey, LCSW

## 2015-08-23 LAB — CULTURE, BLOOD (ROUTINE X 2)
CULTURE: NO GROWTH
CULTURE: NO GROWTH

## 2015-09-15 DEATH — deceased

## 2016-09-18 IMAGING — CT CT CERVICAL SPINE WITHOUT CONTRAST
3 of 5 series · 11 of 27 positions shown, 13 images · non-contrast
Comparison: Head CT September 04, 2010; cervical spine CT March 08, 2010

CLINICAL DATA: History of dementia. Patient status post fall with
abrasion right frontal region.

EXAM:
CT HEAD WITHOUT CONTRAST
CT CERVICAL SPINE WITHOUT CONTRAST
TECHNIQUE: Multidetector CT imaging of the head and cervical spine was
performed following the standard protocol without intravenous
contrast. Multiplanar CT image reconstructions of the cervical spine
were also generated.

[Series 4: head bone · axial · 0.43mm/px · z∈[-132,-87]mm · 3 of 60 slices shown]
[im 15/60  bone]
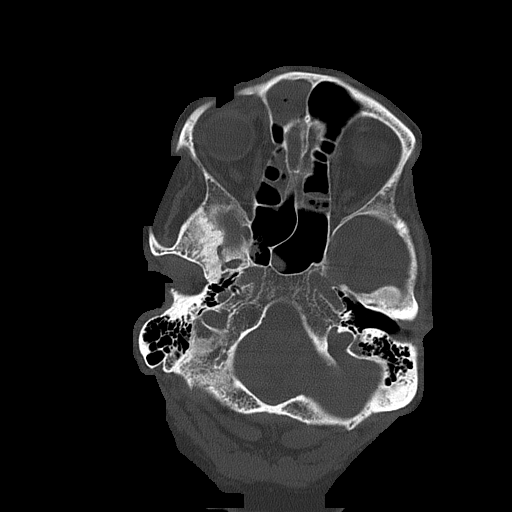
[im 30/60  bone]
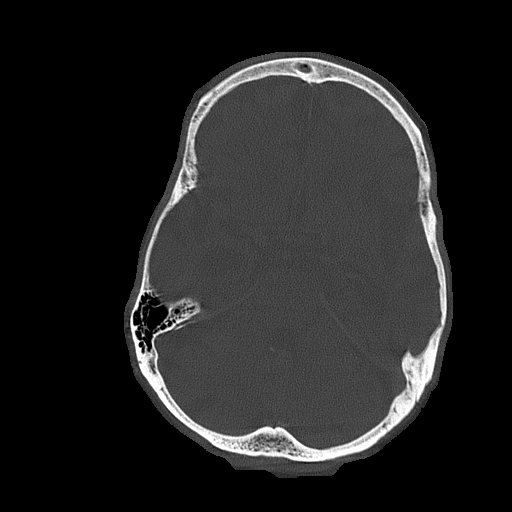
[im 45/60  bone]
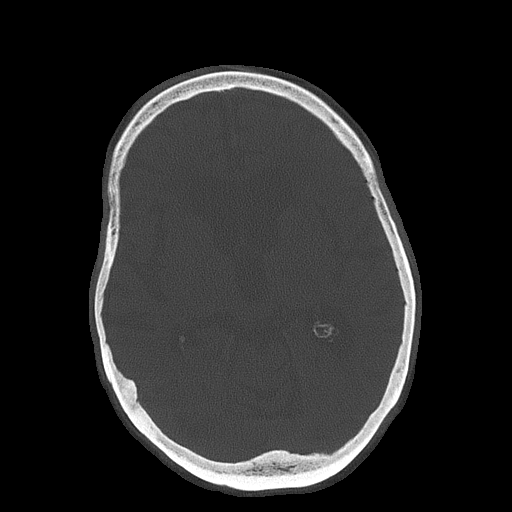

[Series 9: c spine soft · axial · 0.41mm/px · z∈[-222,-160]mm · 3 of 63 slices shown, 4 images]
[im 16/63  soft-tissue]
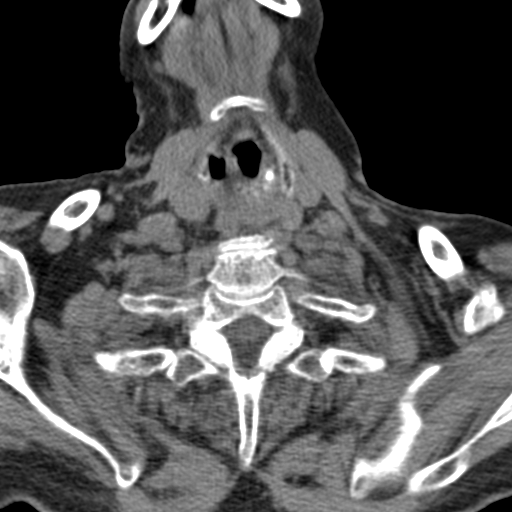
[im 16/63  bone]
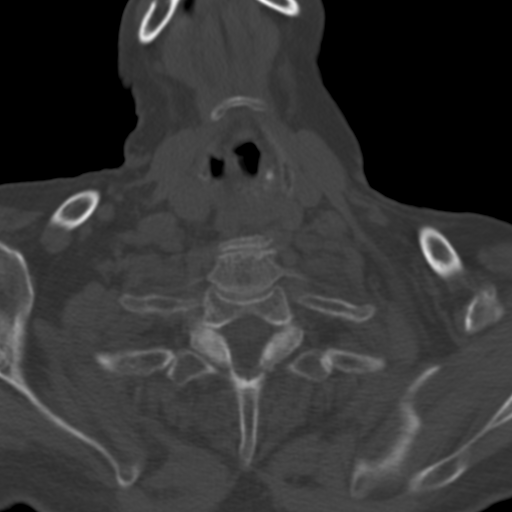
[im 32/63  bone]
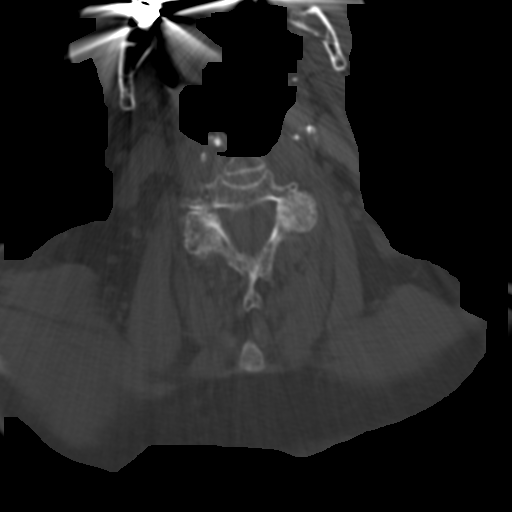
[im 47/63  bone]
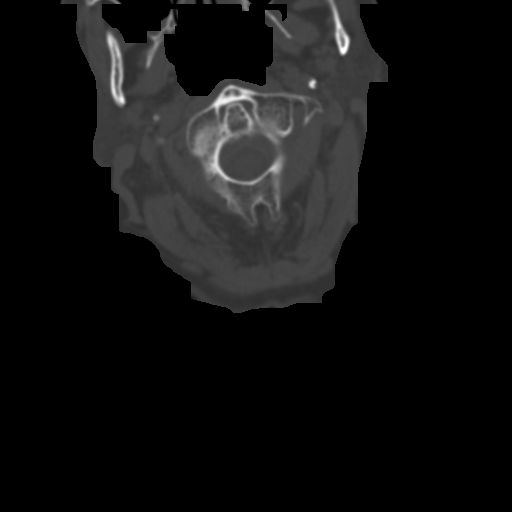

[Series 12: sag bone · sagittal · 0.30mm/px · 5 of 44 slices shown, 6 images]
[im 15/44  bone]
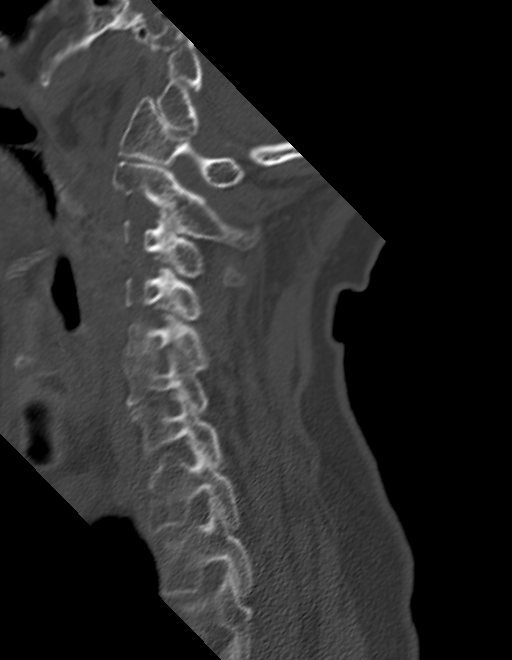
[im 18/44  bone]
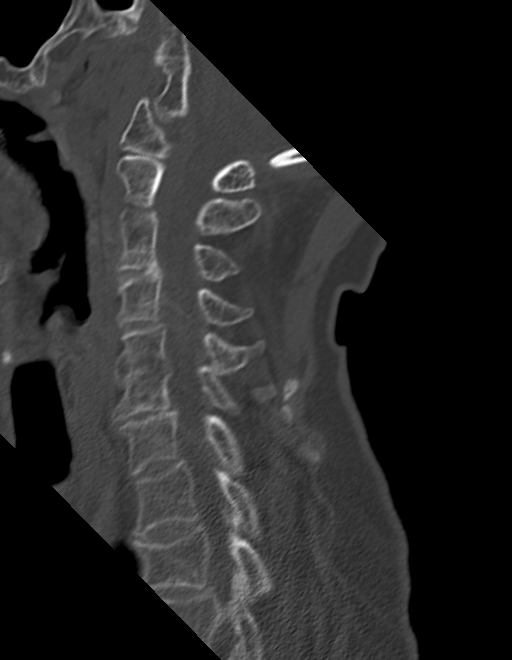
[im 22/44  soft-tissue]
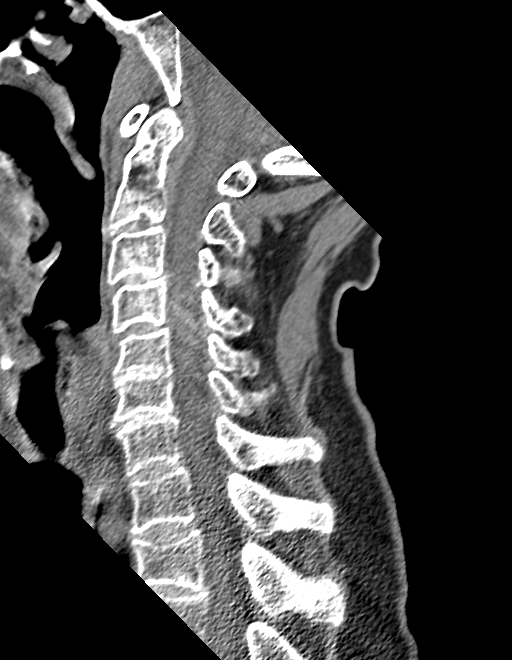
[im 22/44  bone]
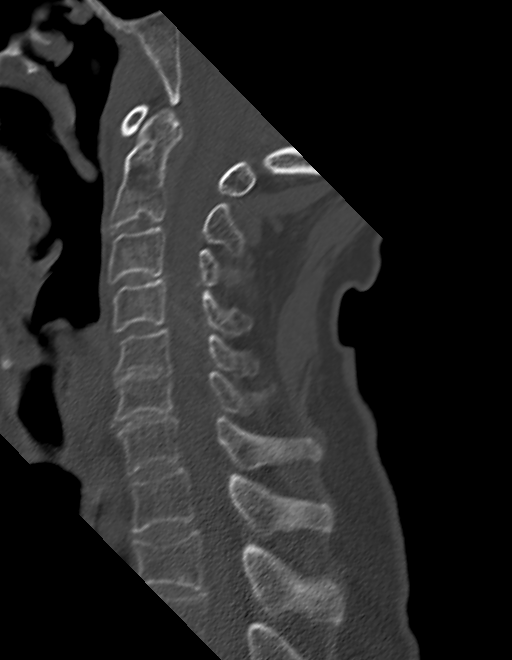
[im 26/44  bone]
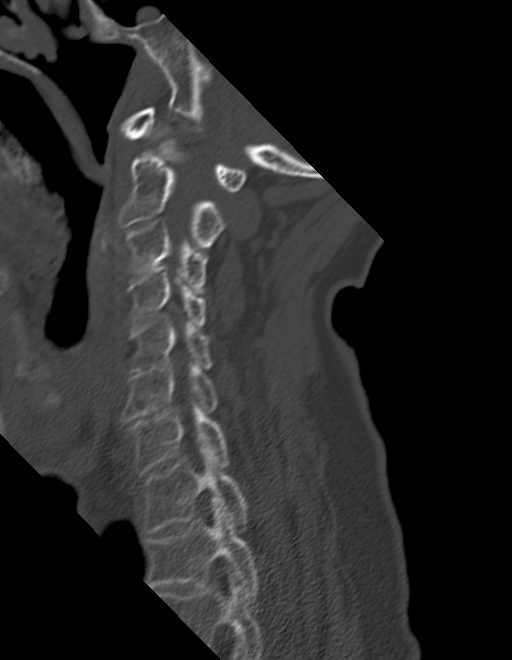
[im 29/44  bone]
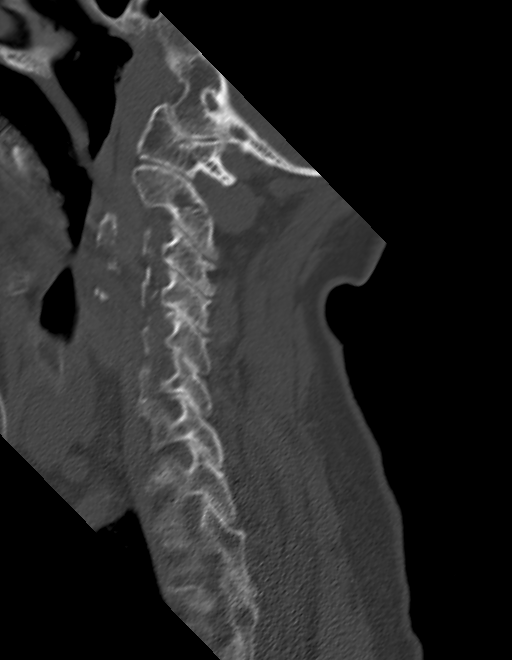

[11 of 27 positions shown; findings below may reference images not displayed]

FINDINGS: CT HEAD FINDINGS

There is moderate diffuse atrophy. There is a focal subdural
hematoma along the anterior falx measuring 9 mm in width and 4.6 cm
in length from anterior to posterior. There is no other extra-axial
fluid. There is no midline shift. There is no appreciable mass
effect. There is extensive small vessel disease throughout the
centra semiovale bilaterally. No acute infarct apparent.

The bony calvarium appears intact. There is a right frontal scalp
hematoma. The mastoid air cells are clear. Note that there is some
sclerosis along the inferior left temporal bone, probably
representing focal fibrous dysplasia.

There is opacification of the right frontal sinus as well as
multiple ethmoid air cells bilaterally. There is also opacification
in the sphenoid sinus regions bilaterally.

CT CERVICAL SPINE FINDINGS

There is no fracture or spondylolisthesis in the cervical region.
There is stable anterior wedging at T2. Prevertebral soft tissues
and predental space regions are normal. There is moderately severe
disc space narrowing at C5-6 and C6-7. There is facet hypertrophy at
most levels bilaterally. No disc extrusion or stenosis. There is
calcification in both carotid arteries. Bones are osteoporotic.
IMPRESSION: CT head: Acute anterior falcine subdural hematoma measuring 4.6 cm
in length and 9 mm in width. There is atrophy with small vessel
disease, stable. There is multifocal paranasal sinus disease. There
is fibrous dysplasia in the inferior left temporal bone. There is a
sizable right scalp hematoma without underlying fracture.

CT cervical spine: No fracture or spondylolisthesis in the cervical
region. There is stable anterior wedging at T2. Multilevel
arthropathy. Calcification in both carotid arteries. Bones
osteoporotic.

Critical Value/emergent results were called by telephone at the time
of interpretation on 11/18/2013 at [DATE] to Dr. NIVIRUS DATABEX , who
verbally acknowledged these results.
